# Patient Record
Sex: Female | Born: 1945 | Race: White | Hispanic: No | Smoking: Former smoker
Health system: Southern US, Community
[De-identification: ages and names within clinical notes are randomized; demographics above are authoritative.]

## PROBLEM LIST (undated history)

## (undated) DIAGNOSIS — N189 Chronic kidney disease, unspecified: Secondary | ICD-10-CM

## (undated) DIAGNOSIS — E119 Type 2 diabetes mellitus without complications: Secondary | ICD-10-CM

## (undated) DIAGNOSIS — J449 Chronic obstructive pulmonary disease, unspecified: Secondary | ICD-10-CM

## (undated) DIAGNOSIS — N390 Urinary tract infection, site not specified: Secondary | ICD-10-CM

## (undated) DIAGNOSIS — R0600 Dyspnea, unspecified: Secondary | ICD-10-CM

## (undated) DIAGNOSIS — R350 Frequency of micturition: Secondary | ICD-10-CM

## (undated) DIAGNOSIS — G4730 Sleep apnea, unspecified: Secondary | ICD-10-CM

## (undated) DIAGNOSIS — C801 Malignant (primary) neoplasm, unspecified: Secondary | ICD-10-CM

## (undated) DIAGNOSIS — R32 Unspecified urinary incontinence: Secondary | ICD-10-CM

## (undated) DIAGNOSIS — R300 Dysuria: Secondary | ICD-10-CM

## (undated) DIAGNOSIS — K219 Gastro-esophageal reflux disease without esophagitis: Secondary | ICD-10-CM

## (undated) DIAGNOSIS — R3915 Urgency of urination: Secondary | ICD-10-CM

## (undated) DIAGNOSIS — E7800 Pure hypercholesterolemia, unspecified: Secondary | ICD-10-CM

## (undated) DIAGNOSIS — R809 Proteinuria, unspecified: Secondary | ICD-10-CM

## (undated) DIAGNOSIS — I10 Essential (primary) hypertension: Secondary | ICD-10-CM

## (undated) HISTORY — PX: CHOLECYSTECTOMY: SHX55

## (undated) HISTORY — PX: BREAST BIOPSY: SHX20

## (undated) HISTORY — DX: Urinary tract infection, site not specified: N39.0

## (undated) HISTORY — DX: Dysuria: R30.0

## (undated) HISTORY — DX: Essential (primary) hypertension: I10

## (undated) HISTORY — PX: PARTIAL HYSTERECTOMY: SHX80

## (undated) HISTORY — DX: Frequency of micturition: R35.0

## (undated) HISTORY — DX: Proteinuria, unspecified: R80.9

## (undated) HISTORY — DX: Urgency of urination: R39.15

## (undated) HISTORY — DX: Type 2 diabetes mellitus without complications: E11.9

## (undated) HISTORY — PX: MEDIAL PARTIAL KNEE REPLACEMENT: SHX5965

## (undated) HISTORY — DX: Unspecified urinary incontinence: R32

## (undated) HISTORY — DX: Pure hypercholesterolemia, unspecified: E78.00

## (undated) HISTORY — PX: ABDOMINAL HYSTERECTOMY: SHX81

## (undated) HISTORY — PX: TONSILLECTOMY: SUR1361

---

## 2005-03-19 ENCOUNTER — Ambulatory Visit: Payer: Self-pay | Admitting: Internal Medicine

## 2006-03-25 ENCOUNTER — Ambulatory Visit: Payer: Self-pay | Admitting: Family Medicine

## 2006-09-13 ENCOUNTER — Ambulatory Visit: Payer: Self-pay | Admitting: Internal Medicine

## 2006-11-09 ENCOUNTER — Ambulatory Visit: Payer: Self-pay | Admitting: Oncology

## 2006-11-14 ENCOUNTER — Ambulatory Visit: Payer: Self-pay | Admitting: Oncology

## 2006-12-09 ENCOUNTER — Ambulatory Visit: Payer: Self-pay | Admitting: Oncology

## 2007-01-09 ENCOUNTER — Ambulatory Visit: Payer: Self-pay | Admitting: Oncology

## 2007-03-09 ENCOUNTER — Ambulatory Visit: Payer: Self-pay | Admitting: Oncology

## 2007-03-18 ENCOUNTER — Ambulatory Visit: Payer: Self-pay | Admitting: Oncology

## 2007-04-08 ENCOUNTER — Ambulatory Visit: Payer: Self-pay | Admitting: Family Medicine

## 2007-04-09 ENCOUNTER — Ambulatory Visit: Payer: Self-pay | Admitting: Oncology

## 2007-07-02 ENCOUNTER — Ambulatory Visit: Payer: Self-pay | Admitting: Oncology

## 2007-07-09 ENCOUNTER — Ambulatory Visit: Payer: Self-pay | Admitting: Oncology

## 2007-09-09 ENCOUNTER — Ambulatory Visit: Payer: Self-pay | Admitting: Oncology

## 2007-09-26 ENCOUNTER — Ambulatory Visit: Payer: Self-pay | Admitting: Oncology

## 2007-10-09 ENCOUNTER — Ambulatory Visit: Payer: Self-pay | Admitting: Oncology

## 2007-11-09 ENCOUNTER — Ambulatory Visit: Payer: Self-pay | Admitting: Oncology

## 2008-01-09 ENCOUNTER — Ambulatory Visit: Payer: Self-pay | Admitting: Oncology

## 2008-01-23 ENCOUNTER — Ambulatory Visit: Payer: Self-pay | Admitting: Oncology

## 2008-02-09 ENCOUNTER — Ambulatory Visit: Payer: Self-pay | Admitting: Oncology

## 2008-04-08 ENCOUNTER — Ambulatory Visit: Payer: Self-pay | Admitting: Oncology

## 2008-04-08 ENCOUNTER — Ambulatory Visit: Payer: Self-pay | Admitting: Family Medicine

## 2008-04-16 ENCOUNTER — Ambulatory Visit: Payer: Self-pay | Admitting: Oncology

## 2008-05-08 ENCOUNTER — Ambulatory Visit: Payer: Self-pay | Admitting: Oncology

## 2008-06-08 ENCOUNTER — Ambulatory Visit: Payer: Self-pay | Admitting: Oncology

## 2008-07-08 ENCOUNTER — Ambulatory Visit: Payer: Self-pay | Admitting: Oncology

## 2008-07-09 ENCOUNTER — Ambulatory Visit: Payer: Self-pay | Admitting: Oncology

## 2008-08-08 ENCOUNTER — Ambulatory Visit: Payer: Self-pay | Admitting: Oncology

## 2008-08-27 ENCOUNTER — Ambulatory Visit: Payer: Self-pay | Admitting: Internal Medicine

## 2008-10-07 ENCOUNTER — Ambulatory Visit: Payer: Self-pay | Admitting: Oncology

## 2008-10-08 ENCOUNTER — Ambulatory Visit: Payer: Self-pay | Admitting: Oncology

## 2009-01-08 ENCOUNTER — Ambulatory Visit: Payer: Self-pay | Admitting: Oncology

## 2009-01-14 ENCOUNTER — Ambulatory Visit: Payer: Self-pay | Admitting: Oncology

## 2009-02-08 ENCOUNTER — Ambulatory Visit: Payer: Self-pay | Admitting: Oncology

## 2009-03-08 ENCOUNTER — Ambulatory Visit: Payer: Self-pay | Admitting: Oncology

## 2009-04-08 ENCOUNTER — Ambulatory Visit: Payer: Self-pay | Admitting: Oncology

## 2009-04-11 ENCOUNTER — Ambulatory Visit: Payer: Self-pay | Admitting: Family Medicine

## 2009-04-22 ENCOUNTER — Ambulatory Visit: Payer: Self-pay | Admitting: Oncology

## 2009-05-08 ENCOUNTER — Ambulatory Visit: Payer: Self-pay | Admitting: Oncology

## 2009-07-08 ENCOUNTER — Ambulatory Visit: Payer: Self-pay | Admitting: Oncology

## 2009-07-19 ENCOUNTER — Ambulatory Visit: Payer: Self-pay | Admitting: Oncology

## 2009-08-08 ENCOUNTER — Ambulatory Visit: Payer: Self-pay | Admitting: Oncology

## 2009-09-08 ENCOUNTER — Ambulatory Visit: Payer: Self-pay | Admitting: Oncology

## 2009-09-30 ENCOUNTER — Ambulatory Visit: Payer: Self-pay | Admitting: Oncology

## 2009-10-08 ENCOUNTER — Ambulatory Visit: Payer: Self-pay | Admitting: Oncology

## 2009-12-22 ENCOUNTER — Ambulatory Visit: Payer: Self-pay | Admitting: Oncology

## 2010-01-08 ENCOUNTER — Ambulatory Visit: Payer: Self-pay | Admitting: Oncology

## 2010-02-21 ENCOUNTER — Ambulatory Visit: Payer: Self-pay | Admitting: Family Medicine

## 2010-03-16 ENCOUNTER — Ambulatory Visit: Payer: Self-pay | Admitting: Oncology

## 2010-03-20 ENCOUNTER — Ambulatory Visit: Payer: Self-pay | Admitting: Surgery

## 2010-04-09 ENCOUNTER — Ambulatory Visit: Payer: Self-pay | Admitting: Oncology

## 2010-04-13 ENCOUNTER — Ambulatory Visit: Payer: Self-pay | Admitting: Family Medicine

## 2010-04-28 ENCOUNTER — Ambulatory Visit: Payer: Self-pay | Admitting: Internal Medicine

## 2010-06-15 ENCOUNTER — Ambulatory Visit: Payer: Self-pay | Admitting: Oncology

## 2010-07-09 ENCOUNTER — Ambulatory Visit: Payer: Self-pay | Admitting: Oncology

## 2010-09-20 ENCOUNTER — Ambulatory Visit: Payer: Self-pay | Admitting: Oncology

## 2010-10-06 ENCOUNTER — Ambulatory Visit: Payer: Self-pay | Admitting: Gastroenterology

## 2010-10-09 ENCOUNTER — Ambulatory Visit: Payer: Self-pay | Admitting: Oncology

## 2010-12-25 ENCOUNTER — Ambulatory Visit: Payer: Self-pay | Admitting: Oncology

## 2011-01-09 ENCOUNTER — Ambulatory Visit: Payer: Self-pay | Admitting: Oncology

## 2011-04-24 ENCOUNTER — Ambulatory Visit: Payer: Self-pay | Admitting: Family Medicine

## 2011-04-24 ENCOUNTER — Ambulatory Visit: Payer: Self-pay | Admitting: Internal Medicine

## 2011-04-24 ENCOUNTER — Ambulatory Visit: Payer: Self-pay | Admitting: Oncology

## 2011-04-24 LAB — CBC CANCER CENTER
Basophil #: 0.1 x10 3/mm (ref 0.0–0.1)
Basophil %: 1.2 %
Eosinophil #: 1.3 x10 3/mm — ABNORMAL HIGH (ref 0.0–0.7)
Lymphocyte %: 19.4 %
MCH: 28.9 pg (ref 26.0–34.0)
MCV: 88 fL (ref 80–100)
Monocyte #: 0.8 x10 3/mm (ref 0.2–0.9)

## 2011-04-24 LAB — FERRITIN: Ferritin (ARMC): 23 ng/mL (ref 8–388)

## 2011-04-24 LAB — IRON AND TIBC
Iron Bind.Cap.(Total): 378 ug/dL (ref 250–450)
Iron Saturation: 26 %
Unbound Iron-Bind.Cap.: 281 ug/dL

## 2011-05-09 ENCOUNTER — Ambulatory Visit: Payer: Self-pay | Admitting: Internal Medicine

## 2011-08-27 ENCOUNTER — Ambulatory Visit: Payer: Self-pay | Admitting: Unknown Physician Specialty

## 2011-10-10 ENCOUNTER — Ambulatory Visit: Payer: Self-pay | Admitting: Oncology

## 2011-10-10 LAB — CBC CANCER CENTER
Basophil #: 0.1 x10 3/mm (ref 0.0–0.1)
Eosinophil #: 0.2 x10 3/mm (ref 0.0–0.7)
HCT: 36.9 % (ref 35.0–47.0)
HGB: 11.8 g/dL — ABNORMAL LOW (ref 12.0–16.0)
Lymphocyte #: 1.6 x10 3/mm (ref 1.0–3.6)
Lymphocyte %: 28.1 %
MCV: 88 fL (ref 80–100)
Monocyte #: 0.8 x10 3/mm (ref 0.2–0.9)
Monocyte %: 14.2 %
Neutrophil %: 52.7 %
Platelet: 343 x10 3/mm (ref 150–440)
RBC: 4.21 10*6/uL (ref 3.80–5.20)
RDW: 16.5 % — ABNORMAL HIGH (ref 11.5–14.5)
WBC: 5.8 x10 3/mm (ref 3.6–11.0)

## 2011-10-10 LAB — IRON AND TIBC
Iron Saturation: 9 %
Unbound Iron-Bind.Cap.: 366 ug/dL

## 2011-10-10 LAB — FERRITIN: Ferritin (ARMC): 11 ng/mL (ref 8–388)

## 2011-11-09 ENCOUNTER — Ambulatory Visit: Payer: Self-pay | Admitting: Oncology

## 2012-02-01 ENCOUNTER — Ambulatory Visit: Payer: Self-pay | Admitting: Unknown Physician Specialty

## 2012-04-02 ENCOUNTER — Ambulatory Visit: Payer: Self-pay | Admitting: Oncology

## 2012-04-02 LAB — CBC CANCER CENTER
Basophil #: 0.1 x10 3/mm (ref 0.0–0.1)
Basophil %: 1.1 %
Eosinophil #: 0.2 x10 3/mm (ref 0.0–0.7)
Eosinophil %: 2.6 %
HGB: 12.1 g/dL (ref 12.0–16.0)
Lymphocyte #: 2 x10 3/mm (ref 1.0–3.6)
Lymphocyte %: 25.1 %
MCH: 29.1 pg (ref 26.0–34.0)
MCHC: 32.7 g/dL (ref 32.0–36.0)
Monocyte %: 8.3 %
Neutrophil #: 5 x10 3/mm (ref 1.4–6.5)
Neutrophil %: 62.9 %
RBC: 4.17 10*6/uL (ref 3.80–5.20)
RDW: 13.9 % (ref 11.5–14.5)
WBC: 8 x10 3/mm (ref 3.6–11.0)

## 2012-04-02 LAB — IRON AND TIBC
Iron Bind.Cap.(Total): 387 ug/dL (ref 250–450)
Iron Saturation: 22 %
Iron: 87 ug/dL (ref 50–170)
Unbound Iron-Bind.Cap.: 300 ug/dL

## 2012-04-08 ENCOUNTER — Ambulatory Visit: Payer: Self-pay | Admitting: Oncology

## 2012-04-16 LAB — CBC CANCER CENTER
Basophil #: 0.1 x10 3/mm (ref 0.0–0.1)
Eosinophil #: 0.1 x10 3/mm (ref 0.0–0.7)
Eosinophil %: 1 %
HGB: 12 g/dL (ref 12.0–16.0)
Lymphocyte #: 2.6 x10 3/mm (ref 1.0–3.6)
MCH: 29.3 pg (ref 26.0–34.0)
MCHC: 33 g/dL (ref 32.0–36.0)
MCV: 89 fL (ref 80–100)
Monocyte %: 9.6 %
Neutrophil %: 60.3 %
Platelet: 387 x10 3/mm (ref 150–440)

## 2012-04-16 LAB — IRON AND TIBC
Iron Bind.Cap.(Total): 432 ug/dL (ref 250–450)
Iron: 61 ug/dL (ref 50–170)

## 2012-04-16 LAB — FERRITIN: Ferritin (ARMC): 27 ng/mL (ref 8–388)

## 2012-04-29 ENCOUNTER — Ambulatory Visit: Payer: Self-pay | Admitting: Internal Medicine

## 2012-05-08 ENCOUNTER — Ambulatory Visit: Payer: Self-pay | Admitting: Oncology

## 2012-05-29 ENCOUNTER — Ambulatory Visit: Payer: Self-pay | Admitting: Specialist

## 2012-07-04 ENCOUNTER — Ambulatory Visit: Payer: Self-pay | Admitting: Internal Medicine

## 2012-09-05 ENCOUNTER — Ambulatory Visit: Payer: Self-pay | Admitting: Unknown Physician Specialty

## 2012-10-22 ENCOUNTER — Ambulatory Visit: Payer: Self-pay | Admitting: Oncology

## 2012-10-22 LAB — IRON AND TIBC
Iron Bind.Cap.(Total): 420 ug/dL (ref 250–450)
Iron Saturation: 15 %
Iron: 62 ug/dL (ref 50–170)
Unbound Iron-Bind.Cap.: 358 ug/dL

## 2012-10-22 LAB — FERRITIN: Ferritin (ARMC): 11 ng/mL (ref 8–388)

## 2012-11-04 ENCOUNTER — Ambulatory Visit: Payer: Self-pay | Admitting: Unknown Physician Specialty

## 2012-11-08 ENCOUNTER — Ambulatory Visit: Payer: Self-pay | Admitting: Oncology

## 2012-11-20 ENCOUNTER — Ambulatory Visit: Payer: Self-pay | Admitting: Specialist

## 2013-01-27 ENCOUNTER — Ambulatory Visit: Payer: Self-pay | Admitting: Vascular Surgery

## 2013-01-27 LAB — BASIC METABOLIC PANEL
Anion Gap: 6 — ABNORMAL LOW (ref 7–16)
BUN: 13 mg/dL (ref 7–18)
CALCIUM: 9.3 mg/dL (ref 8.5–10.1)
CO2: 30 mmol/L (ref 21–32)
Chloride: 100 mmol/L (ref 98–107)
Creatinine: 1.05 mg/dL (ref 0.60–1.30)
EGFR (Non-African Amer.): 55 — ABNORMAL LOW
Glucose: 150 mg/dL — ABNORMAL HIGH (ref 65–99)
OSMOLALITY: 275 (ref 275–301)
Potassium: 4 mmol/L (ref 3.5–5.1)
Sodium: 136 mmol/L (ref 136–145)

## 2013-03-12 ENCOUNTER — Ambulatory Visit: Payer: Self-pay | Admitting: Unknown Physician Specialty

## 2013-04-28 ENCOUNTER — Ambulatory Visit: Payer: Self-pay | Admitting: Internal Medicine

## 2013-04-28 LAB — FERRITIN: Ferritin (ARMC): 31 ng/mL (ref 8–388)

## 2013-04-28 LAB — CBC CANCER CENTER
BASOS ABS: 0.1 x10 3/mm (ref 0.0–0.1)
Basophil %: 1.1 %
EOS PCT: 6.5 %
Eosinophil #: 0.6 x10 3/mm (ref 0.0–0.7)
HCT: 36.9 % (ref 35.0–47.0)
HGB: 12 g/dL (ref 12.0–16.0)
Lymphocyte #: 2 x10 3/mm (ref 1.0–3.6)
Lymphocyte %: 23.5 %
MCH: 30.5 pg (ref 26.0–34.0)
MCHC: 32.6 g/dL (ref 32.0–36.0)
MCV: 94 fL (ref 80–100)
MONOS PCT: 10 %
Monocyte #: 0.9 x10 3/mm (ref 0.2–0.9)
NEUTROS ABS: 5.1 x10 3/mm (ref 1.4–6.5)
Neutrophil %: 58.9 %
Platelet: 382 x10 3/mm (ref 150–440)
RBC: 3.94 10*6/uL (ref 3.80–5.20)
RDW: 14.2 % (ref 11.5–14.5)
WBC: 8.6 x10 3/mm (ref 3.6–11.0)

## 2013-04-28 LAB — IRON AND TIBC
IRON SATURATION: 29 %
IRON: 105 ug/dL (ref 50–170)
Iron Bind.Cap.(Total): 361 ug/dL (ref 250–450)
UNBOUND IRON-BIND. CAP.: 256 ug/dL

## 2013-04-30 ENCOUNTER — Ambulatory Visit: Payer: Self-pay | Admitting: Internal Medicine

## 2013-05-08 ENCOUNTER — Ambulatory Visit: Payer: Self-pay | Admitting: Oncology

## 2013-05-19 DIAGNOSIS — R9430 Abnormal result of cardiovascular function study, unspecified: Secondary | ICD-10-CM | POA: Insufficient documentation

## 2013-05-28 ENCOUNTER — Ambulatory Visit: Payer: Self-pay | Admitting: Specialist

## 2013-06-02 ENCOUNTER — Ambulatory Visit: Payer: Self-pay | Admitting: Family Medicine

## 2013-10-19 DIAGNOSIS — K2100 Gastro-esophageal reflux disease with esophagitis, without bleeding: Secondary | ICD-10-CM | POA: Insufficient documentation

## 2013-10-19 DIAGNOSIS — G4730 Sleep apnea, unspecified: Secondary | ICD-10-CM | POA: Insufficient documentation

## 2013-10-19 DIAGNOSIS — E1140 Type 2 diabetes mellitus with diabetic neuropathy, unspecified: Secondary | ICD-10-CM | POA: Insufficient documentation

## 2013-10-19 DIAGNOSIS — E1169 Type 2 diabetes mellitus with other specified complication: Secondary | ICD-10-CM | POA: Insufficient documentation

## 2013-10-19 DIAGNOSIS — M1990 Unspecified osteoarthritis, unspecified site: Secondary | ICD-10-CM | POA: Insufficient documentation

## 2013-10-28 ENCOUNTER — Ambulatory Visit: Payer: Self-pay | Admitting: Oncology

## 2013-10-28 LAB — CBC CANCER CENTER
Basophil #: 0.1 x10 3/mm (ref 0.0–0.1)
Basophil %: 1.5 %
EOS ABS: 0.3 x10 3/mm (ref 0.0–0.7)
EOS PCT: 3.6 %
HCT: 35.5 % (ref 35.0–47.0)
HGB: 11.6 g/dL — ABNORMAL LOW (ref 12.0–16.0)
Lymphocyte #: 2 x10 3/mm (ref 1.0–3.6)
Lymphocyte %: 27.2 %
MCH: 28.5 pg (ref 26.0–34.0)
MCHC: 32.8 g/dL (ref 32.0–36.0)
MCV: 87 fL (ref 80–100)
Monocyte #: 0.7 x10 3/mm (ref 0.2–0.9)
Monocyte %: 9.8 %
Neutrophil #: 4.2 x10 3/mm (ref 1.4–6.5)
Neutrophil %: 57.9 %
PLATELETS: 360 x10 3/mm (ref 150–440)
RBC: 4.09 10*6/uL (ref 3.80–5.20)
RDW: 15.1 % — ABNORMAL HIGH (ref 11.5–14.5)
WBC: 7.2 x10 3/mm (ref 3.6–11.0)

## 2013-10-28 LAB — IRON AND TIBC
IRON BIND. CAP.(TOTAL): 399 ug/dL (ref 250–450)
Iron Saturation: 16 %
Iron: 65 ug/dL (ref 50–170)
Unbound Iron-Bind.Cap.: 334 ug/dL

## 2013-10-28 LAB — FERRITIN: Ferritin (ARMC): 19 ng/mL (ref 8–388)

## 2013-11-08 ENCOUNTER — Ambulatory Visit: Payer: Self-pay | Admitting: Oncology

## 2013-11-17 ENCOUNTER — Ambulatory Visit: Payer: Self-pay | Admitting: Internal Medicine

## 2013-12-08 ENCOUNTER — Ambulatory Visit: Payer: Self-pay | Admitting: Internal Medicine

## 2014-02-19 ENCOUNTER — Ambulatory Visit: Payer: Self-pay | Admitting: Oncology

## 2014-03-09 ENCOUNTER — Ambulatory Visit
Admit: 2014-03-09 | Disposition: A | Discharge status: Home / Self Care | Payer: Self-pay | Attending: Oncology | Admitting: Oncology

## 2014-04-23 ENCOUNTER — Other Ambulatory Visit: Payer: Self-pay | Admitting: Specialist

## 2014-04-23 DIAGNOSIS — R69 Illness, unspecified: Secondary | ICD-10-CM

## 2014-05-01 NOTE — Op Note (Signed)
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   PROCEDURES PERFORMED: 1.  Abdominal aortogram.  2.  Bilateral lower extremity distal runoff.  3.  Introduction of catheter, aorta, right femoral approach.  4.  Introduction of catheter, aorta, left femoral approach.  5.  Percutaneous transluminal angioplasty and stent placement, left common iliac artery.  6.  Percutaneous transluminal angioplasty and stent placement, right common iliac artery.   SURGEON: Hortencia Pilar, M.D.   SEDATION: Versed 3 mg plus fentanyl 100 mcg administered IV. Continuous ECG, pulse oximetry and cardiopulmonary monitoring is performed throughout the entire procedure by the interventional radiology nurse. Total sedation time is 1 hour, 15 minutes.   ACCESSES: 1.  A 6-French sheath, right common femoral artery.  2.  A 6-French sheath, left common femoral artery.   CONTRAST USED: Isovue 105 mL.   FLUOROSCOPY TIME: 5.6 minutes.   INDICATIONS: Jenna Olson is a 69 year old woman who presented with increasing lower extremity pain, particularly with walking. She notes that the right leg is much worse than the left. On the initial evaluation, the patient felt that she would try to ambulate more to see if this would alleviate her symptoms; however, she now returns for followup stating that she has had no improvement and that the difficulty with walking is profoundly impacting her life in a very negative way in which she wishes to have something done. Risks and benefits were reviewed. All questions answered. The patient agrees to proceed.   DESCRIPTION OF PROCEDURE: The patient is taken to special procedures and  placed in the supine position. After adequate sedation is achieved, both groins are prepped and draped in sterile fashion. Ultrasound is placed in a sterile sleeve. Left common femoral artery is identified. It is echolucent and pulsatile indicating patency. Image is recorded for the permanent record. Under direct ultrasound visualization, 1% lidocaine is infiltrated in the soft tissues and subsequently a micropuncture needle is inserted, microwire followed by microsheath, J-wire followed by 5-French sheath and 5-French pigtail catheter. The pigtail catheter is positioned at the level of T12 and AP projection of the aorta is obtained with a bolus injection of contrast.   The pigtail catheter is then repositioned and a LAO view of the pelvis is obtained. A string sign is noted in the proximal right common iliac and therefore 4000 units of heparin was given. Magic torque wire is then advanced through the pigtail catheter on the left, and the sheath is upsized to a 6-French sheath.   Using ultrasound guidance, the right common femoral artery is identified. It is echolucent and pulsatile indicating patency. Image is recorded for the permanent record. Under real-time visualization lidocaine is infiltrated in the soft tissues and then a micropuncture needle is used to access the anterior wall of the common femoral artery, microwire followed by microsheath, J-wire followed by a 6-French sheath. Magic torque wire is then negotiated through the stenosis and into the aorta from the right groin approach. Having both wires in place and having heparinized the patient, kissing balloon technique will be utilized. Measurements are made of the iliacs as well as the aorta and it is elected to use a 7 x 29 Omnilink stent on the right and a 6 x 6 balloon on the  left. The stent is positioned with its tip just a millimeter or so above the actual flow divider of the aorta and simultaneous inflation is made. Subsequent image through the  pigtail catheter demonstrates that there is some overhang from the right into the left and therefore a 7 x 29 Omnilink is advanced up the left side and the 7 balloon is reintroduced on the right. Simultaneous inflation is made to complete a kissing balloon/stent technique. Follow-up angiography through a pigtail catheter demonstrates the right side is still somewhat undersized as is the left, and therefore an 8 x 4 Rival balloon is advanced first up the left side. With a 7 balloon on the right, simultaneous inflation is performed and then the balloons are switched and the right side is treated with the 8 balloon while the left is supported with a 7 balloon.   The pigtail catheter is then reintroduced via the left groin approach and subsequently imaging is performed of the aortic bifurcation and then after verifying adequate results with the stents down to the level of the knees. Subsequently, the trifurcation is imaged by hand injection through each individual sheath. Oblique view of the right groin is then obtained and a StarClose device deployed successfully. Oblique view of the left groin is then obtained and a StarClose device deployed successfully. There are no immediate complications.   INTERPRETATION: Images of the abdominal aorta demonstrates mild atherosclerotic changes, but there are no hemodynamically significant stenoses. The aorta does taper somewhat from its diameter of approximately 12 mm to 9 mm distally and there is a high-grade string sign noted in the proximal common iliac on the right. The left demonstrates diffuse disease, but does not appear to have a focal hemodynamically significant stenosis and therefore I elected to try to use a stent on just one side with kissing balloon on the other. This was not successful in controlling the hangover of the right-sided stent and therefore a stent was added on the left side in a traditional fashion. Following stenting there is wide patency of both  common iliacs and both have been dilated to a maximal diameter of 8 mm. The external iliacs, common femorals, profunda femoris, superficial femorals, and trifurcations are widely patent bilaterally.   SUMMARY: Successful recanalization of the distal aorta and bilateral common iliacs as described above. ____________________________ Katha Cabal, MD ggs:sb D: 01/27/2013 14:45:31 ET T: 01/27/2013 15:35:47 ET JOB#: [id number]  cc: Katha Cabal, MD, <Dictator> Kathrene Alu., MD Christena Flake. Raechel Ache, [place] MD ELECTRONICALLY SIGNED 02/18/2013 13:24

## 2014-05-03 ENCOUNTER — Ambulatory Visit
Admit: 2014-05-03 | Disposition: A | Discharge status: Home / Self Care | Payer: Self-pay | Attending: Gastroenterology | Admitting: Gastroenterology

## 2014-05-04 ENCOUNTER — Ambulatory Visit
Admit: 2014-05-04 | Disposition: A | Discharge status: Home / Self Care | Payer: Self-pay | Attending: Internal Medicine | Admitting: Internal Medicine

## 2014-05-05 LAB — SURGICAL PATHOLOGY

## 2014-05-18 ENCOUNTER — Other Ambulatory Visit: Payer: Self-pay | Admitting: Oncology

## 2014-05-18 DIAGNOSIS — D509 Iron deficiency anemia, unspecified: Secondary | ICD-10-CM | POA: Insufficient documentation

## 2014-05-19 ENCOUNTER — Inpatient Hospital Stay: Payer: Medicare PPO | Attending: Oncology

## 2014-05-19 DIAGNOSIS — I2510 Atherosclerotic heart disease of native coronary artery without angina pectoris: Secondary | ICD-10-CM | POA: Insufficient documentation

## 2014-05-19 DIAGNOSIS — E119 Type 2 diabetes mellitus without complications: Secondary | ICD-10-CM | POA: Diagnosis not present

## 2014-05-19 DIAGNOSIS — D509 Iron deficiency anemia, unspecified: Secondary | ICD-10-CM | POA: Diagnosis present

## 2014-05-19 DIAGNOSIS — I10 Essential (primary) hypertension: Secondary | ICD-10-CM | POA: Diagnosis not present

## 2014-05-19 DIAGNOSIS — Z79899 Other long term (current) drug therapy: Secondary | ICD-10-CM | POA: Insufficient documentation

## 2014-05-19 DIAGNOSIS — E780 Pure hypercholesterolemia: Secondary | ICD-10-CM | POA: Diagnosis not present

## 2014-05-19 LAB — FERRITIN: Ferritin: 70 ng/mL (ref 11–307)

## 2014-05-19 LAB — CBC WITH DIFFERENTIAL/PLATELET
Basophils Absolute: 0.1 10*3/uL (ref 0–0.1)
Basophils Relative: 1 %
Eosinophils Absolute: 0.3 10*3/uL (ref 0–0.7)
Eosinophils Relative: 3 %
HEMATOCRIT: 38.9 % (ref 35.0–47.0)
Hemoglobin: 13.1 g/dL (ref 12.0–16.0)
LYMPHS ABS: 2.2 10*3/uL (ref 1.0–3.6)
LYMPHS PCT: 24 %
MCH: 30.5 pg (ref 26.0–34.0)
MCHC: 33.5 g/dL (ref 32.0–36.0)
MCV: 91 fL (ref 80.0–100.0)
Monocytes Absolute: 1 10*3/uL — ABNORMAL HIGH (ref 0.2–0.9)
Monocytes Relative: 12 %
NEUTROS PCT: 60 %
Neutro Abs: 5.3 10*3/uL (ref 1.4–6.5)
PLATELETS: 328 10*3/uL (ref 150–440)
RBC: 4.28 MIL/uL (ref 3.80–5.20)
RDW: 17.9 % — AB (ref 11.5–14.5)
WBC: 8.9 10*3/uL (ref 3.6–11.0)

## 2014-05-19 LAB — IRON AND TIBC
Iron: 59 ug/dL (ref 28–170)
SATURATION RATIOS: 17 % (ref 10.4–31.8)
TIBC: 340 ug/dL (ref 250–450)
UIBC: 282 ug/dL

## 2014-05-21 ENCOUNTER — Encounter: Payer: Self-pay | Admitting: Oncology

## 2014-05-21 ENCOUNTER — Inpatient Hospital Stay (HOSPITAL_BASED_OUTPATIENT_CLINIC_OR_DEPARTMENT_OTHER): Payer: Medicare PPO | Admitting: Oncology

## 2014-05-21 ENCOUNTER — Inpatient Hospital Stay: Payer: Medicare PPO

## 2014-05-21 VITALS — BP 147/74 | HR 85 | Temp 96.8°F | Resp 20 | Wt 198.4 lb

## 2014-05-21 DIAGNOSIS — E119 Type 2 diabetes mellitus without complications: Secondary | ICD-10-CM | POA: Diagnosis not present

## 2014-05-21 DIAGNOSIS — I10 Essential (primary) hypertension: Secondary | ICD-10-CM | POA: Diagnosis not present

## 2014-05-21 DIAGNOSIS — Z79899 Other long term (current) drug therapy: Secondary | ICD-10-CM

## 2014-05-21 DIAGNOSIS — D509 Iron deficiency anemia, unspecified: Secondary | ICD-10-CM

## 2014-05-31 ENCOUNTER — Ambulatory Visit
Admission: RE | Admit: 2014-05-31 | Discharge: 2014-05-31 | Disposition: A | Discharge status: Home / Self Care | Payer: Medicare PPO | Source: Ambulatory Visit | Attending: Specialist | Admitting: Specialist

## 2014-05-31 DIAGNOSIS — R69 Illness, unspecified: Secondary | ICD-10-CM

## 2014-05-31 DIAGNOSIS — R918 Other nonspecific abnormal finding of lung field: Secondary | ICD-10-CM | POA: Insufficient documentation

## 2014-06-09 NOTE — Progress Notes (Signed)
[facility]  [phone] Fax:(336) [phone]  ID: Duanne Limerick OB: 04/06/1945  MR#: [id number]  [id number]  Patient Care Team: Ezequiel Kayser, MD as PCP - General (Internal Medicine)  CHIEF COMPLAINT:  Chief Complaint  Patient presents with  . Follow-up    feraheme   INTERVAL HISTORY: Patient returns to clinic today for repeat laboratory work and further evaluation. She currently feels well and is asymptomatic. She does not complain of weakness or fatigue today. She denies any shortness of breath, dyspnea on exertion, chest pain, or cough. She denies any melena or hematochezia. She has had no fever, chills, night sweats, or weight loss.  She offers no specific complaints today.  REVIEW OF SYSTEMS:   Review of Systems  Constitutional: Negative.  Negative for malaise/fatigue.  Respiratory: Negative.   Neurological: Negative for weakness.    As per HPI. Otherwise, a complete review of systems is negatve.  PAST MEDICAL HISTORY: Past Medical History  Diagnosis Date  . Diabetes mellitus without complication   . Hypertension   . High cholesterol     PAST SURGICAL HISTORY: Past Surgical History  Procedure Laterality Date  . Partial hysterectomy    . Cholecystectomy      FAMILY HISTORY Family History  Problem Relation Age of Onset  . Cancer Grandchild     ewing sarcoma       ADVANCED DIRECTIVES:    HEALTH MAINTENANCE: History  Substance Use Topics  . Smoking status: Not on file  . Smokeless tobacco: Not on file  . Alcohol Use: Not on file     Colonoscopy:  PAP:  Bone density:  Lipid panel:  Allergies not on file  Current Outpatient Prescriptions  Medication Sig Dispense Refill  . acetaminophen (TYLENOL) 500 MG tablet Take 500 mg by mouth every 6 (six) hours as needed.    Marland Kitchen aspirin 81 MG tablet Take 81 mg by mouth daily.    Marland Kitchen azelastine (ASTELIN) 0.1 % nasal spray Place 2 sprays into both nostrils 2 (two) times daily.  Use in each nostril as directed    . cholecalciferol (VITAMIN D) 400 UNITS TABS tablet Take 400 Units by mouth daily.    . diphenhydrAMINE (SOMINEX) 25 MG tablet Take 25 mg by mouth at bedtime as needed for sleep.    Marland Kitchen esomeprazole (NEXIUM) 40 MG capsule     . fexofenadine (ALLEGRA) 180 MG tablet Take 180 mg by mouth daily.    . fluticasone (FLONASE) 50 MCG/ACT nasal spray     . furosemide (LASIX) 20 MG tablet     . gabapentin (NEURONTIN) 800 MG tablet     . glimepiride (AMARYL) 2 MG tablet     . hydrALAZINE (APRESOLINE) 50 MG tablet     . metFORMIN (GLUCOPHAGE) 500 MG tablet     . niacin (NIASPAN) 500 MG CR tablet     . nitrofurantoin (MACRODANTIN) 100 MG capsule     . polyethylene glycol (MIRALAX / GLYCOLAX) packet Take 17 g by mouth daily.    . pravastatin (PRAVACHOL) 40 MG tablet     . rOPINIRole (REQUIP) 1 MG tablet     . traZODone (DESYREL) 50 MG tablet     . valsartan (DIOVAN) 320 MG tablet     . venlafaxine XR (EFFEXOR-XR) 75 MG 24 hr capsule     . vitamin B-12 (CYANOCOBALAMIN) 250 MCG tablet Take 250 mcg by mouth daily.     No current facility-administered medications for this visit.  OBJECTIVE: Filed Vitals:   05/21/14 1003  BP: 147/74  Pulse: 85  Temp: 96.8 F (36 C)  Resp: 20     There is no height on file to calculate BMI.    ECOG FS:0 - Asymptomatic  General: Well-developed, well-nourished, no acute distress. Eyes: anicteric sclera. Lungs: Clear to auscultation bilaterally. Heart: Regular rate and rhythm. No rubs, murmurs, or gallops. Abdomen: Soft, nontender, nondistended. No organomegaly noted, normoactive bowel sounds. Musculoskeletal: No edema, cyanosis, or clubbing. Neuro: Alert, answering all questions appropriately. Cranial nerves grossly intact. Skin: No rashes or petechiae noted. Psych: Normal affect.    LAB RESULTS:  Lab Results  Component Value Date   NA 136 01/27/2013   K 4.0 01/27/2013   CL 100 01/27/2013   CO2 30 01/27/2013   GLUCOSE  150* 01/27/2013   BUN 13 01/27/2013   CREATININE 1.05 01/27/2013   CALCIUM 9.3 01/27/2013   GFRNONAA 55* 01/27/2013   GFRAA >60 01/27/2013    Lab Results  Component Value Date   WBC 8.9 05/19/2014   NEUTROABS 5.3 05/19/2014   HGB 13.1 05/19/2014   HCT 38.9 05/19/2014   MCV 91.0 05/19/2014   PLT 328 05/19/2014     STUDIES: Ct Chest Wo Contrast  05/31/2014   CLINICAL DATA:  Subsequent encounter for pulmonary nodules.  EXAM: CT CHEST WITHOUT CONTRAST  TECHNIQUE: Multidetector CT imaging of the chest was performed following the standard protocol without IV contrast.  COMPARISON:  05/28/2013.  05/29/2012.  FINDINGS: Mediastinum / Lymph Nodes: There is no axillary lymphadenopathy. Small scattered lymph nodes are seen in the mediastinum. No hilar lymphadenopathy. Thoracic esophagus is normal in appearance. Heart size is normal. Coronary artery calcification is noted. No pericardial effusion.  Lungs / Pleura: Centrilobular and paraseptal emphysema is noted bilaterally. The pair of tiny nodules seen in the right upper lobe previously is unchanged (see image 16 and 17 of series 3 today). No new or enlarging pulmonary nodule or mass. No focal airspace consolidation. No pulmonary edema or pleural effusion.  MSK / Soft Tissues: Bone windows reveal no worrisome lytic or sclerotic osseous lesions.  Upper Abdomen: Visualized portions of the liver and spleen are unremarkable. Cortical scarring is noted in the visualized portions of the kidneys. Tiny left adrenal nodule is stable with density measurements consistent with adenoma.  IMPRESSION: No change in the tiny right upper lobe pulmonary nodules, each measuring about 3 mm in size. These been stable for 2 years, consistent with benign disease. No further imaging followup of these nodules is indicated. This recommendation follows the consensus statement: Guidelines for Management of Small Pulmonary Nodules Detected on CT Scans: A Statement from the [employer] as published in Radiology 2005; 237:395-400.  No new or progressive findings on today's study.   Electronically Signed   By: Misty Stanley M.D.   On: 05/31/2014 14:39    ASSESSMENT: Iron-deficiency anemia, refractory oral iron therapy.  PLAN:    1.  Anemia:  Patient's hemoglobin and iron stores are now within normal limits. She last received Feraheme in February 2016. No intervention is needed at this time. Patient will then return to clinic in 4 months with repeat laboratory work, further evaluation, and consideration of additional IV iron.  Previously, colonoscopy, EGD, and capsule endoscopy in 2007 did not reveal an etiology of her iron deficiency anemia.     Patient expressed understanding and was in agreement with this plan. She also understands that She can call clinic at any time with any  questions, concerns, or complaints.    Lloyd Huger, MD   06/09/2014 1:04 PM

## 2014-06-18 DIAGNOSIS — I10 Essential (primary) hypertension: Secondary | ICD-10-CM | POA: Insufficient documentation

## 2014-06-29 DIAGNOSIS — I872 Venous insufficiency (chronic) (peripheral): Secondary | ICD-10-CM | POA: Insufficient documentation

## 2014-07-23 ENCOUNTER — Other Ambulatory Visit: Payer: Self-pay | Admitting: Urology

## 2014-07-26 NOTE — Telephone Encounter (Signed)
Can pt have refills?

## 2014-08-26 ENCOUNTER — Encounter: Payer: Self-pay | Admitting: *Deleted

## 2014-09-06 ENCOUNTER — Encounter: Payer: Self-pay | Admitting: Urology

## 2014-09-06 ENCOUNTER — Ambulatory Visit (INDEPENDENT_AMBULATORY_CARE_PROVIDER_SITE_OTHER): Payer: Medicare PPO | Admitting: Urology

## 2014-09-06 VITALS — BP 165/81 | HR 102 | Ht 62.0 in | Wt 202.5 lb

## 2014-09-06 DIAGNOSIS — N390 Urinary tract infection, site not specified: Secondary | ICD-10-CM | POA: Insufficient documentation

## 2014-09-06 DIAGNOSIS — R350 Frequency of micturition: Secondary | ICD-10-CM

## 2014-09-06 LAB — URINALYSIS, COMPLETE
Bilirubin, UA: NEGATIVE
Glucose, UA: NEGATIVE
KETONES UA: NEGATIVE
NITRITE UA: NEGATIVE
PROTEIN UA: NEGATIVE
RBC, UA: NEGATIVE
Specific Gravity, UA: <1.005 — ABNORMAL LOW (ref 1.005–1.030)
UUROB: 0.2 mg/dL (ref 0.2–1.0)
pH, UA: 5.5 (ref 5.0–7.5)

## 2014-09-06 LAB — MICROSCOPIC EXAMINATION: RBC, UA: NONE SEEN /hpf (ref 0–?)

## 2014-09-06 LAB — BLADDER SCAN AMB NON-IMAGING: Scan Result: 106

## 2014-09-06 NOTE — Progress Notes (Signed)
09/06/2014 4:13 PM   Jenna Olson 03-31-1945 [id number]  Referring provider: Ezequiel Kayser, MD [name], [name]  Chief Complaint  Patient presents with  . Follow-up    HPI: Patient is a 69 year old white female who has been on Macrobid every other day for prophylactic antibiotic for recurrent urinary tract infections. She presents today for yearly follow-up.  She states one month ago she started to experience an increase of frequent urination, urinary urgency, nocturia and straining to urinate. She has very mild stress incontinence and no urge incontinence.  She is having to use the toilet every 2 hours.  She denies any dysuria, suprapubic pain or gross hematuria.  She has not had any recent fevers, chills, nausea or vomiting.  Her UA showed contamination with WBCs and vaginal up at the heel cells. It was nitrite negative.   PMH: Past Medical History  Diagnosis Date  . Diabetes mellitus without complication   . Hypertension   . High cholesterol   . Urinary frequency   . UTI (lower urinary tract infection)   . Dysuria   . Incontinence   . Urinary urgency   . Proteinuria     Surgical History: Past Surgical History  Procedure Laterality Date  . Partial hysterectomy    . Cholecystectomy      Home Medications:    Medication List       This list is accurate as of: 09/06/14  4:13 PM.  Always use your most recent med list.               acetaminophen 500 MG tablet  Commonly known as:  TYLENOL  Take 500 mg by mouth every 6 (six) hours as needed.     aspirin 81 MG tablet  Take 81 mg by mouth daily.     azelastine 0.1 % nasal spray  Commonly known as:  ASTELIN  Place 2 sprays into both nostrils 2 (two) times daily. Use in each nostril as directed     cholecalciferol 400 UNITS Tabs tablet  Commonly known as:  VITAMIN D  Take 400 Units by mouth daily.     diphenhydrAMINE 25 MG tablet  Commonly known as:  SOMINEX  Take 25 mg by  mouth at bedtime as needed for sleep.     esomeprazole 40 MG capsule  Commonly known as:  NEXIUM     fexofenadine 180 MG tablet  Commonly known as:  ALLEGRA  Take 180 mg by mouth daily.     fluticasone 50 MCG/ACT nasal spray  Commonly known as:  FLONASE     furosemide 20 MG tablet  Commonly known as:  LASIX     gabapentin 800 MG tablet  Commonly known as:  NEURONTIN     glimepiride 2 MG tablet  Commonly known as:  AMARYL     hydrALAZINE 50 MG tablet  Commonly known as:  APRESOLINE     metFORMIN 500 MG tablet  Commonly known as:  GLUCOPHAGE     niacin 500 MG CR tablet  Commonly known as:  NIASPAN     pravastatin 40 MG tablet  Commonly known as:  PRAVACHOL     rOPINIRole 1 MG tablet  Commonly known as:  REQUIP     traZODone 50 MG tablet  Commonly known as:  DESYREL     valsartan 320 MG tablet  Commonly known as:  DIOVAN     venlafaxine XR 75 MG 24 hr capsule  Commonly known as:  EFFEXOR-XR     vitamin B-12 250 MCG tablet  Commonly known as:  CYANOCOBALAMIN  Take 250 mcg by mouth daily.        Allergies:  Allergies  Allergen Reactions  . Atorvastatin Other (See Comments)    Elevated lft's  . Hydrochlorothiazide Other (See Comments)    Hyponatremia  . Sulfa Antibiotics Other (See Comments)  . Amlodipine Swelling    Family History: Family History  Problem Relation Age of Onset  . Cancer Grandchild     ewing sarcoma    Social History:  reports that she has quit smoking. She does not have any smokeless tobacco history on file. She reports that she does not drink alcohol. Her drug history is not on file.  ROS: UROLOGY Frequent Urination?: Yes Hard to postpone urination?: Yes Burning/pain with urination?: No Get up at night to urinate?: Yes Leakage of urine?: No Urine stream starts and stops?: No Trouble starting stream?: No Do you have to strain to urinate?: Yes Blood in urine?: No Urinary tract infection?: No Sexually transmitted disease?:  No Injury to kidneys or bladder?: No Painful intercourse?: No Weak stream?: No Currently pregnant?: No Vaginal bleeding?: No Last menstrual period?: n  Gastrointestinal Nausea?: No Vomiting?: No Indigestion/heartburn?: No Diarrhea?: No Constipation?: No  Constitutional Fever: No Night sweats?: No Weight loss?: No Fatigue?: No  Skin Skin rash/lesions?: No Itching?: No  Eyes Blurred vision?: No Double vision?: No  Ears/Nose/Throat Sore throat?: No Sinus problems?: No  Hematologic/Lymphatic Swollen glands?: No Easy bruising?: No  Cardiovascular Leg swelling?: No Chest pain?: No  Respiratory Cough?: No Shortness of breath?: No  Endocrine Excessive thirst?: No  Musculoskeletal Back pain?: No Joint pain?: No  Neurological Headaches?: No Dizziness?: No  Psychologic Depression?: No Anxiety?: No  Physical Exam: BP 165/81 mmHg  Pulse 102  Ht 5\' 2"  (1.575 m)  Wt 202 lb 8 oz (91.853 kg)  BMI 37.03 kg/m2  GU:  Atrophic external genitalia.  Normal urethral meatus. No urethral masses and/or tenderness.  Grade 1 cystocele is noted. No bladder fullness or masses. No vaginal lesions or discharge. Normal rectal tone, no masses. Normal anus and perineum.   Laboratory Data: Results for orders placed or performed in visit on 09/06/14  Microscopic Examination  Result Value Ref Range   WBC, UA 11-30 (A) 0 -  5 /hpf   RBC, UA None seen 0 -  2 /hpf   Epithelial Cells (non renal) 0-10 0 - 10 /hpf   Bacteria, UA Few (A) None seen/Few  Urinalysis, Complete  Result Value Ref Range   Specific Gravity, UA <1.005 (L) 1.005 - 1.030   pH, UA 5.5 5.0 - 7.5   Color, UA Yellow Yellow   Appearance Ur Clear Clear   Leukocytes, UA 1+ (A) Negative   Protein, UA Negative Negative/Trace   Glucose, UA Negative Negative   Ketones, UA Negative Negative   RBC, UA Negative Negative   Bilirubin, UA Negative Negative   Urobilinogen, Ur 0.2 0.2 - 1.0 mg/dL   Nitrite, UA Negative  Negative   Microscopic Examination See below:   BLADDER SCAN AMB NON-IMAGING  Result Value Ref Range   Scan Result 106    Lab Results  Component Value Date   WBC 8.9 05/19/2014   HGB 13.1 05/19/2014   HCT 38.9 05/19/2014   MCV 91.0 05/19/2014   PLT 328 05/19/2014    Lab Results  Component Value Date   CREATININE 1.05 01/27/2013    Assessment & Plan:  1. Recurrent UTI:   Patient has a history recurrent UTI controlled with Macrobid every other day.  Today's UA is unremarkable.    - Urinalysis, Complete - BLADDER SCAN AMB NON-IMAGING  2. Urinary frequency:   Patient with urinary frequency having to void every 2 hours.  Her urine dip was negative for glucose.  Her UA was not suspicious for infection.  I will initiate anticholinergic therapy with Vesicare 5 mg daily (#28).  Samples are given to the patient.   I have advised the patient of the side effects of Vesicare, such as: Dry eyes, dry mouth, constipation, mental confusion and/or urinary retention.  She will return in 3 weeks for symptom recheck and PVR.   No Follow-up on file.  Zara Council, [facility] [address], [place], [place] [address] [phone]

## 2014-09-14 ENCOUNTER — Inpatient Hospital Stay: Payer: Medicare PPO | Attending: Oncology

## 2014-09-14 DIAGNOSIS — E119 Type 2 diabetes mellitus without complications: Secondary | ICD-10-CM | POA: Diagnosis not present

## 2014-09-14 DIAGNOSIS — Z87891 Personal history of nicotine dependence: Secondary | ICD-10-CM | POA: Insufficient documentation

## 2014-09-14 DIAGNOSIS — D509 Iron deficiency anemia, unspecified: Secondary | ICD-10-CM | POA: Insufficient documentation

## 2014-09-14 DIAGNOSIS — Z79899 Other long term (current) drug therapy: Secondary | ICD-10-CM | POA: Diagnosis not present

## 2014-09-14 DIAGNOSIS — E780 Pure hypercholesterolemia: Secondary | ICD-10-CM | POA: Insufficient documentation

## 2014-09-14 DIAGNOSIS — I10 Essential (primary) hypertension: Secondary | ICD-10-CM | POA: Diagnosis not present

## 2014-09-14 LAB — CBC WITH DIFFERENTIAL/PLATELET
BASOS PCT: 1 %
Basophils Absolute: 0.1 10*3/uL (ref 0–0.1)
EOS ABS: 0.2 10*3/uL (ref 0–0.7)
Eosinophils Relative: 2 %
HCT: 39.3 % (ref 35.0–47.0)
HEMOGLOBIN: 13 g/dL (ref 12.0–16.0)
Lymphocytes Relative: 27 %
Lymphs Abs: 2.6 10*3/uL (ref 1.0–3.6)
MCH: 30 pg (ref 26.0–34.0)
MCHC: 33.2 g/dL (ref 32.0–36.0)
MCV: 90.3 fL (ref 80.0–100.0)
Monocytes Absolute: 1 10*3/uL — ABNORMAL HIGH (ref 0.2–0.9)
Monocytes Relative: 11 %
NEUTROS PCT: 59 %
Neutro Abs: 5.7 10*3/uL (ref 1.4–6.5)
Platelets: 290 10*3/uL (ref 150–440)
RBC: 4.35 MIL/uL (ref 3.80–5.20)
RDW: 14.1 % (ref 11.5–14.5)
WBC: 9.6 10*3/uL (ref 3.6–11.0)

## 2014-09-14 LAB — FERRITIN: Ferritin: 33 ng/mL (ref 11–307)

## 2014-09-14 LAB — IRON AND TIBC
Iron: 44 ug/dL (ref 28–170)
SATURATION RATIOS: 12 % (ref 10.4–31.8)
TIBC: 380 ug/dL (ref 250–450)
UIBC: 336 ug/dL

## 2014-09-17 ENCOUNTER — Inpatient Hospital Stay: Payer: Medicare PPO

## 2014-09-17 ENCOUNTER — Inpatient Hospital Stay (HOSPITAL_BASED_OUTPATIENT_CLINIC_OR_DEPARTMENT_OTHER): Payer: Medicare PPO | Admitting: Oncology

## 2014-09-17 VITALS — BP 173/80 | HR 94 | Temp 97.7°F | Resp 18

## 2014-09-17 DIAGNOSIS — D509 Iron deficiency anemia, unspecified: Secondary | ICD-10-CM | POA: Diagnosis not present

## 2014-09-17 DIAGNOSIS — Z79899 Other long term (current) drug therapy: Secondary | ICD-10-CM

## 2014-09-17 NOTE — Progress Notes (Signed)
Jenna Olson  Telephone:(336) [phone] Fax:(336) [phone]  ID: Jenna Olson OB: [birth year]  MR#: [id number]  [id number]  Patient Care Team: Ezequiel Kayser, MD as PCP - General (Internal Medicine)  CHIEF COMPLAINT:  Chief Complaint  Patient presents with  . Anemia   INTERVAL HISTORY: Patient returns to clinic today for repeat laboratory work and further evaluation. She currently feels well and is asymptomatic. She does not complain of weakness or fatigue today. She denies any shortness of breath, dyspnea on exertion, chest pain, or cough. She denies any melena or hematochezia. She has had no fever, chills, night sweats, or weight loss.  She offers no specific complaints today.  REVIEW OF SYSTEMS:   Review of Systems  Constitutional: Negative.  Negative for malaise/fatigue.  Respiratory: Negative.   Musculoskeletal: Negative.   Neurological: Negative.  Negative for weakness.    As per HPI. Otherwise, a complete review of systems is negatve.  PAST MEDICAL HISTORY: Past Medical History  Diagnosis Date  . Diabetes mellitus without complication   . Hypertension   . High cholesterol   . Urinary frequency   . UTI (lower urinary tract infection)   . Dysuria   . Incontinence   . Urinary urgency   . Proteinuria     PAST SURGICAL HISTORY: Past Surgical History  Procedure Laterality Date  . Partial hysterectomy    . Cholecystectomy      FAMILY HISTORY Family History  Problem Relation Age of Onset  . Cancer Grandchild     ewing sarcoma       ADVANCED DIRECTIVES:    HEALTH MAINTENANCE: Social History  Substance Use Topics  . Smoking status: Former Research scientist (life sciences)  . Smokeless tobacco: Not on file     Comment: quit 15 years  . Alcohol Use: No     Colonoscopy:  PAP:  Bone density:  Lipid panel:  Allergies  Allergen Reactions  . Atorvastatin Other (See Comments)    Elevated lft's  . Hydrochlorothiazide Other (See Comments)    Hyponatremia    . Sulfa Antibiotics Other (See Comments)  . Amlodipine Swelling    Current Outpatient Prescriptions  Medication Sig Dispense Refill  . acetaminophen (TYLENOL) 500 MG tablet Take 500 mg by mouth every 6 (six) hours as needed.    . AMITIZA 24 MCG capsule     . aspirin 81 MG tablet Take 81 mg by mouth daily.    Marland Kitchen azelastine (ASTELIN) 0.1 % nasal spray Place 2 sprays into both nostrils 2 (two) times daily. Use in each nostril as directed    . cholecalciferol (VITAMIN D) 400 UNITS TABS tablet Take 400 Units by mouth daily.    . diphenhydrAMINE (SOMINEX) 25 MG tablet Take 25 mg by mouth at bedtime as needed for sleep.    Marland Kitchen esomeprazole (NEXIUM) 40 MG capsule     . fexofenadine (ALLEGRA) 180 MG tablet Take 180 mg by mouth daily.    . fluticasone (FLONASE) 50 MCG/ACT nasal spray     . furosemide (LASIX) 20 MG tablet     . gabapentin (NEURONTIN) 800 MG tablet 400 mg at bedtime.     Marland Kitchen glimepiride (AMARYL) 2 MG tablet     . hydrALAZINE (APRESOLINE) 50 MG tablet     . metFORMIN (GLUCOPHAGE) 500 MG tablet     . niacin (NIASPAN) 500 MG CR tablet     . pravastatin (PRAVACHOL) 40 MG tablet     . rOPINIRole (REQUIP) 1 MG tablet     .  traZODone (DESYREL) 50 MG tablet     . valsartan (DIOVAN) 320 MG tablet     . venlafaxine XR (EFFEXOR-XR) 75 MG 24 hr capsule     . vitamin B-12 (CYANOCOBALAMIN) 250 MCG tablet Take 250 mcg by mouth daily.     No current facility-administered medications for this visit.    OBJECTIVE: Filed Vitals:   09/17/14 1213  BP: 173/80  Pulse: 94  Temp: 97.7 F (36.5 C)  Resp: 18     There is no weight on file to calculate BMI.    ECOG FS:0 - Asymptomatic  General: Well-developed, well-nourished, no acute distress. Eyes: anicteric sclera. Lungs: Clear to auscultation bilaterally. Heart: Regular rate and rhythm. No rubs, murmurs, or gallops. Abdomen: Soft, nontender, nondistended. No organomegaly noted, normoactive bowel sounds. Musculoskeletal: No edema, cyanosis, or  clubbing. Neuro: Alert, answering all questions appropriately. Cranial nerves grossly intact. Skin: No rashes or petechiae noted. Psych: Normal affect.    LAB RESULTS:  Lab Results  Component Value Date   NA 136 01/27/2013   K 4.0 01/27/2013   CL 100 01/27/2013   CO2 30 01/27/2013   GLUCOSE 150* 01/27/2013   BUN 13 01/27/2013   CREATININE 1.05 01/27/2013   CALCIUM 9.3 01/27/2013   GFRNONAA 55* 01/27/2013   GFRAA >60 01/27/2013    Lab Results  Component Value Date   WBC 9.6 09/14/2014   NEUTROABS 5.7 09/14/2014   HGB 13.0 09/14/2014   HCT 39.3 09/14/2014   MCV 90.3 09/14/2014   PLT 290 09/14/2014     STUDIES: No results found.  ASSESSMENT: Iron-deficiency anemia.  PLAN:    1.  Anemia:  Patient's hemoglobin and iron stores are now within normal limits. She last received Feraheme in February 2016. No intervention is needed at this time. Patient will then return to clinic in 4 months with repeat laboratory work, further evaluation, and consideration of additional IV iron.  Previously, colonoscopy, EGD, and capsule endoscopy in 2007 did not reveal an etiology of her iron deficiency anemia.   She has been instructed to continue her oral iron supplementation as prescribed.   Patient expressed understanding and was in agreement with this plan. She also understands that She can call clinic at any time with any questions, concerns, or complaints.    Lloyd Huger, MD   09/17/2014 1:39 PM

## 2014-09-24 ENCOUNTER — Ambulatory Visit: Payer: Medicare PPO | Admitting: Oncology

## 2014-09-28 ENCOUNTER — Encounter: Payer: Self-pay | Admitting: Urology

## 2014-09-28 ENCOUNTER — Ambulatory Visit (INDEPENDENT_AMBULATORY_CARE_PROVIDER_SITE_OTHER): Payer: Medicare PPO | Admitting: Urology

## 2014-09-28 VITALS — BP 154/75 | HR 89 | Ht 61.25 in | Wt 203.8 lb

## 2014-09-28 DIAGNOSIS — R350 Frequency of micturition: Secondary | ICD-10-CM

## 2014-09-28 DIAGNOSIS — N390 Urinary tract infection, site not specified: Secondary | ICD-10-CM | POA: Diagnosis not present

## 2014-09-28 LAB — BLADDER SCAN AMB NON-IMAGING: Scan Result: 0

## 2014-09-28 MED ORDER — SOLIFENACIN SUCCINATE 5 MG PO TABS: 5.0000 mg | ORAL_TABLET | Freq: Every day | ORAL | Status: DC

## 2014-09-28 NOTE — Progress Notes (Signed)
09/28/2014 2:10 PM   [place] 1945-12-03 [id number]  Referring provider: Ezequiel Kayser, MD [place], [place] [id number]  Chief Complaint  Patient presents with  . Urinary Frequency    3 week follow up    HPI: Patient is a 69 year old white female who was started on Vesicare 5 mg daily at her last visit for symptoms of increasing frequency, urinary urgency, nocturia and straining to urinate.  She states she has noticed her symptoms improving the longer she stays on the medication.  She is not experiencing any untoward side effects.  Her PVR is 0 mL.    She is not experiencing any dysuria, hematuria or suprapubic pain.    PMH: Past Medical History  Diagnosis Date  . Diabetes mellitus without complication   . Hypertension   . High cholesterol   . Urinary frequency   . UTI (lower urinary tract infection)   . Dysuria   . Incontinence   . Urinary urgency   . Proteinuria     Surgical History: Past Surgical History  Procedure Laterality Date  . Partial hysterectomy    . Cholecystectomy      Home Medications:    Medication List       This list is accurate as of: 09/28/14  2:10 PM.  Always use your most recent med list.               acetaminophen 500 MG tablet  Commonly known as:  TYLENOL  Take 500 mg by mouth every 6 (six) hours as needed.     AMITIZA 24 MCG capsule  Generic drug:  lubiprostone     aspirin 81 MG tablet  Take 81 mg by mouth daily.     azelastine 0.1 % nasal spray  Commonly known as:  ASTELIN  Place 2 sprays into both nostrils 2 (two) times daily. Use in each nostril as directed     cholecalciferol 400 UNITS Tabs tablet  Commonly known as:  VITAMIN D  Take 400 Units by mouth daily.     diphenhydrAMINE 25 MG tablet  Commonly known as:  SOMINEX  Take 25 mg by mouth at bedtime as needed for sleep.     esomeprazole 40 MG capsule  Commonly known as:  NEXIUM     fexofenadine 180 MG tablet  Commonly known as:   ALLEGRA  Take 180 mg by mouth daily.     fluticasone 50 MCG/ACT nasal spray  Commonly known as:  FLONASE     furosemide 20 MG tablet  Commonly known as:  LASIX     gabapentin 800 MG tablet  Commonly known as:  NEURONTIN  400 mg at bedtime.     glimepiride 2 MG tablet  Commonly known as:  AMARYL     hydrALAZINE 50 MG tablet  Commonly known as:  APRESOLINE     metFORMIN 500 MG tablet  Commonly known as:  GLUCOPHAGE     niacin 500 MG CR tablet  Commonly known as:  NIASPAN     pravastatin 40 MG tablet  Commonly known as:  PRAVACHOL     PROAIR HFA 108 (90 BASE) MCG/ACT inhaler  Generic drug:  albuterol  Inhale into the lungs.     rOPINIRole 1 MG tablet  Commonly known as:  REQUIP     solifenacin 5 MG tablet  Commonly known as:  VESICARE  Take 1 tablet (5 mg total) by mouth daily.     traZODone 50  MG tablet  Commonly known as:  DESYREL     valsartan 320 MG tablet  Commonly known as:  DIOVAN     venlafaxine XR 75 MG 24 hr capsule  Commonly known as:  EFFEXOR-XR     vitamin B-12 250 MCG tablet  Commonly known as:  CYANOCOBALAMIN  Take 250 mcg by mouth daily.        Allergies:  Allergies  Allergen Reactions  . Atorvastatin Other (See Comments)    Elevated lft's  . Hydrochlorothiazide Other (See Comments)    Hyponatremia  . Sulfa Antibiotics Other (See Comments)  . Amlodipine Swelling    Family History: Family History  Problem Relation Age of Onset  . Cancer Grandchild     ewing sarcoma    Social History:  reports that she has quit smoking. She does not have any smokeless tobacco history on file. She reports that she does not drink alcohol or use illicit drugs.  ROS: UROLOGY Frequent Urination?: Yes Hard to postpone urination?: Yes Burning/pain with urination?: No Get up at night to urinate?: Yes Leakage of urine?: No Urine stream starts and stops?: No Trouble starting stream?: No Do you have to strain to urinate?: Yes Blood in urine?:  No Urinary tract infection?: No Sexually transmitted disease?: No Injury to kidneys or bladder?: No Painful intercourse?: No Weak stream?: No Currently pregnant?: No Vaginal bleeding?: No Last menstrual period?: n  Gastrointestinal Nausea?: No Vomiting?: No Indigestion/heartburn?: No Diarrhea?: No Constipation?: No  Constitutional Fever: No Night sweats?: No Weight loss?: No Fatigue?: Yes  Skin Skin rash/lesions?: No Itching?: No  Eyes Blurred vision?: No Double vision?: No  Ears/Nose/Throat Sore throat?: No Sinus problems?: No  Hematologic/Lymphatic Swollen glands?: No Easy bruising?: No  Cardiovascular Leg swelling?: Yes Chest pain?: No  Respiratory Cough?: No Shortness of breath?: No  Endocrine Excessive thirst?: No  Musculoskeletal Back pain?: No Joint pain?: No  Neurological Headaches?: No Dizziness?: No  Psychologic Depression?: No Anxiety?: No  Physical Exam: BP 154/75 mmHg  Pulse 89  Ht 5' 1.25" (1.556 m)  Wt 203 lb 12.8 oz (92.443 kg)  BMI 38.18 kg/m2   Laboratory Data: Lab Results  Component Value Date   WBC 9.6 09/14/2014   HGB 13.0 09/14/2014   HCT 39.3 09/14/2014   MCV 90.3 09/14/2014   PLT 290 09/14/2014    Lab Results  Component Value Date   CREATININE 1.05 01/27/2013    Urinalysis    Component Value Date/Time   GLUCOSEU Negative 09/06/2014 1616   BILIRUBINUR Negative 09/06/2014 1616   NITRITE Negative 09/06/2014 1616   LEUKOCYTESUR 1+* 09/06/2014 1616    Pertinent Imaging: Results for KATIUSKA, MALINSKI [facility] (MRN [id number]) as of 09/28/2014 14:06  Ref. Range 09/28/2014 13:48  Scan Result Unknown 0    Assessment & Plan:    1. Recurrent UTI: Patient has a history recurrent UTI controlled with Macrobid every other day. Today's UA is unremarkable.   2. Urinary frequency: Patient is noticing improvement in her urinary symptoms with the Vesicare.  She would like to continue the medication.  I will send  in a prescription for the patient.  I also gave her samples (#28) to cover her if her insurance will require a prior authorization.    - BLADDER SCAN AMB NON-IMAGING  Return in about 3 months (around 12/28/2014) for PVR.  Zara Council, [facility] [address] [place], [place] [address] [phone]

## 2014-10-28 IMAGING — CT CT CHEST W/O CM
2 of 3 series · 15 of 36 positions shown, 18 images · non-contrast
Comparison: CT chest dated 11/20/2012 and 05/29/2012

CLINICAL DATA: Follow-up pulmonary nodule

EXAM:
CT CHEST WITHOUT CONTRAST
TECHNIQUE: Multidetector CT imaging of the chest was performed following the
standard protocol without IV contrast..

[Series 2: soft tissue · axial · 0.63mm/px · z∈[-622,-347]mm · 12 of 65 slices shown, 15 images]
[im 5/65  mediastinal]
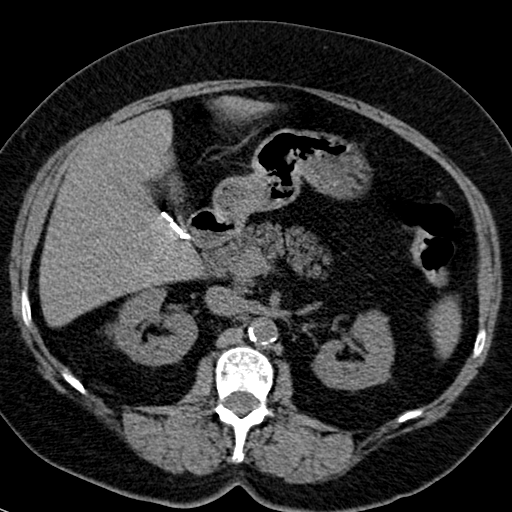
[im 5/65  lung]
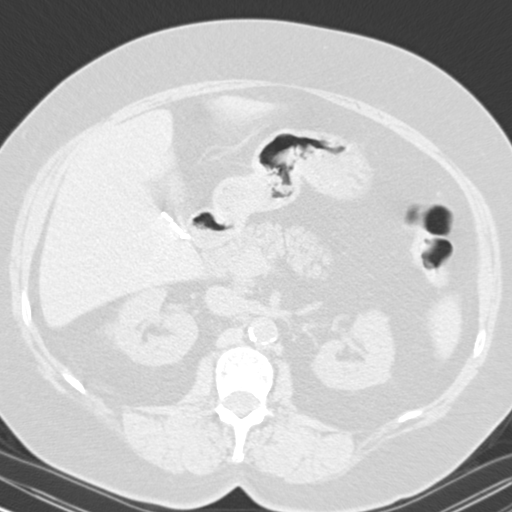
[im 10/65  lung]
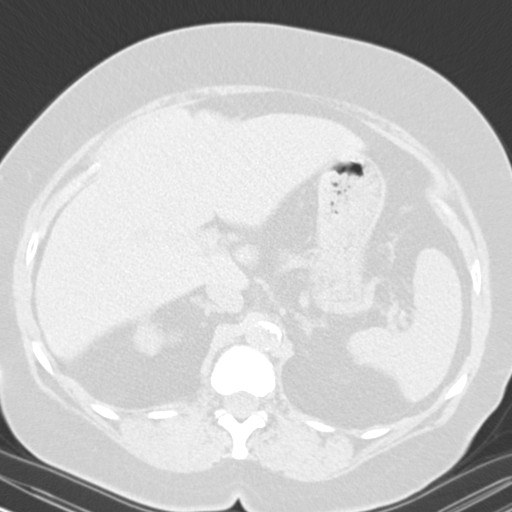
[im 15/65  lung]
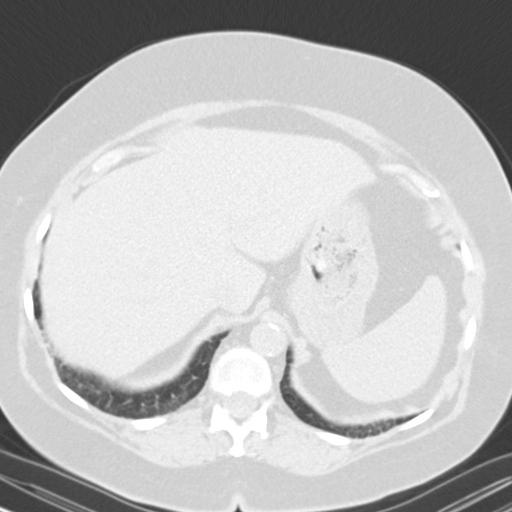
[im 19/65  lung]
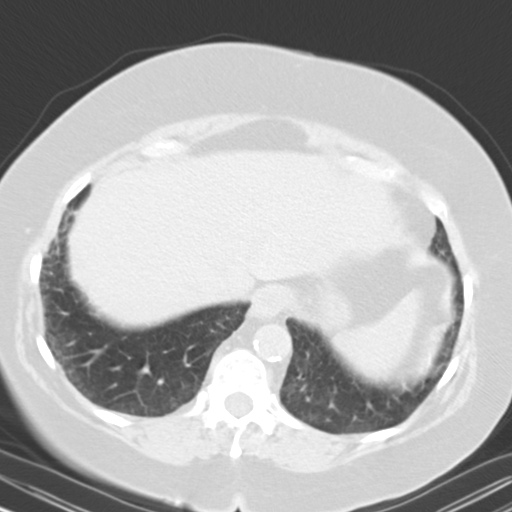
[im 24/65  mediastinal]
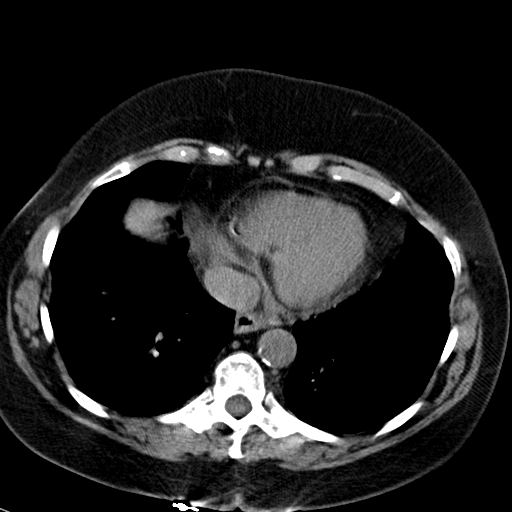
[im 24/65  lung]
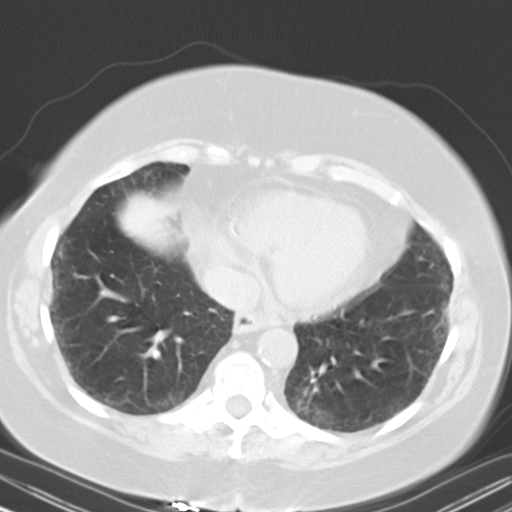
[im 29/65  lung]
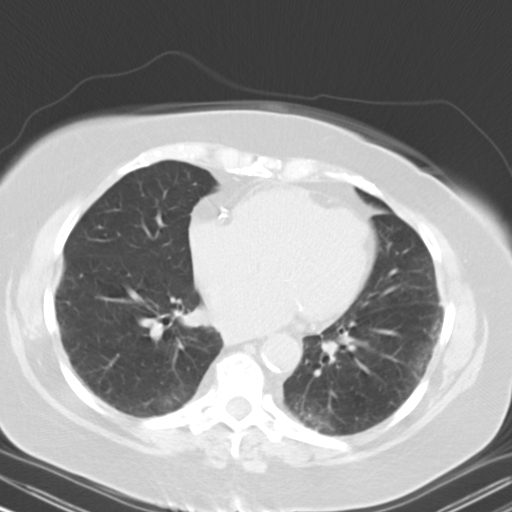
[im 36/65  lung]
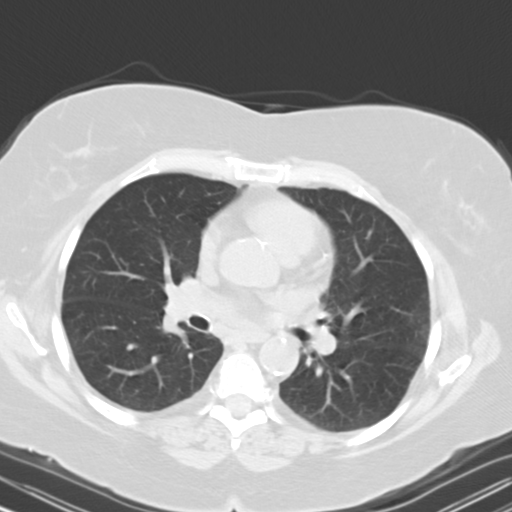
[im 41/65  lung]
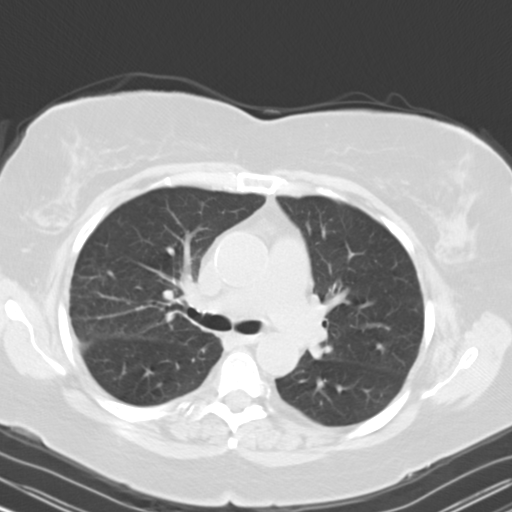
[im 46/65  mediastinal]
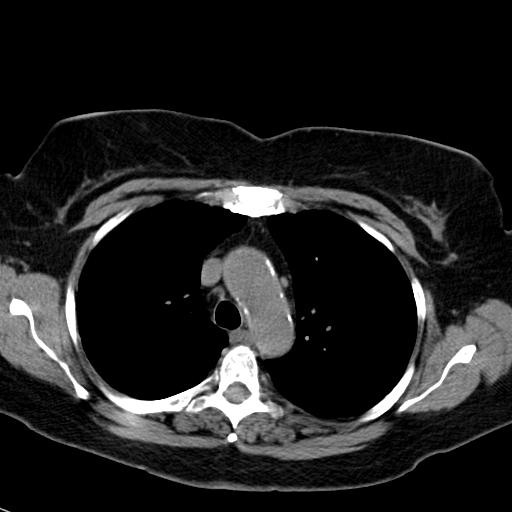
[im 46/65  lung]
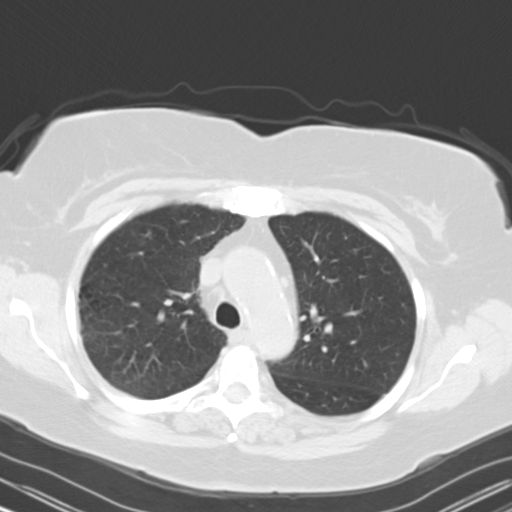
[im 50/65  lung]
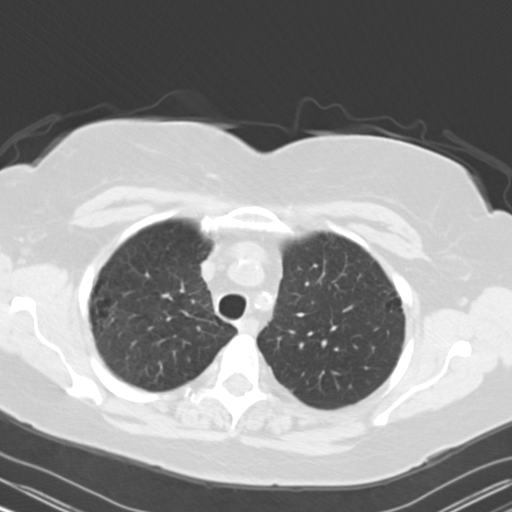
[im 55/65  lung]
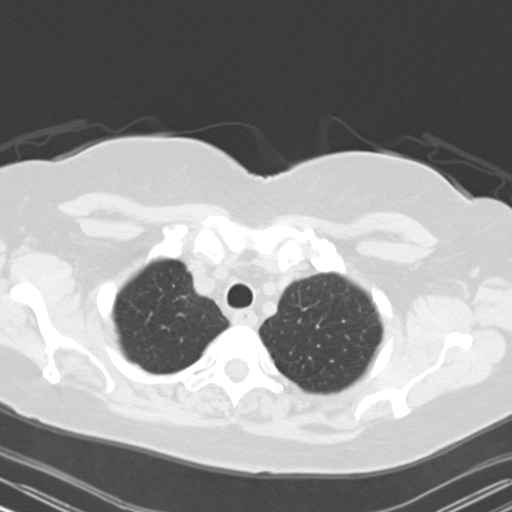
[im 60/65  lung]
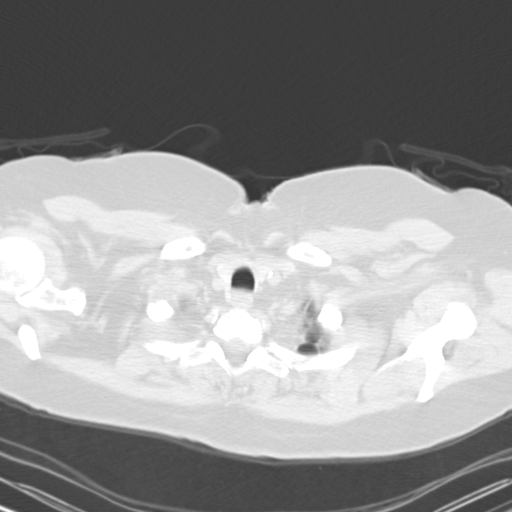

[Series 602: coronal · coronal · 0.63mm/px · 3 of 121 slices shown]
[im 25/121  lung]
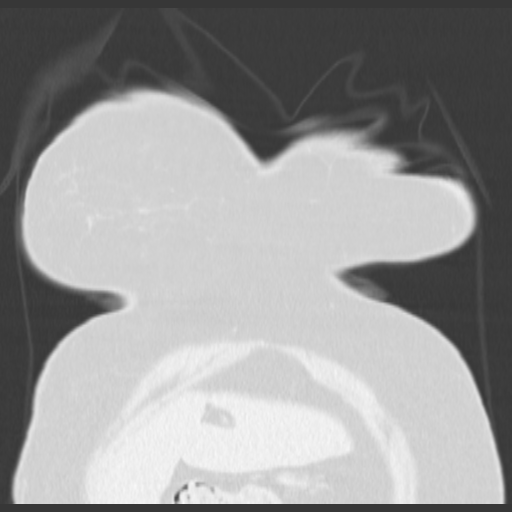
[im 49/121  lung]
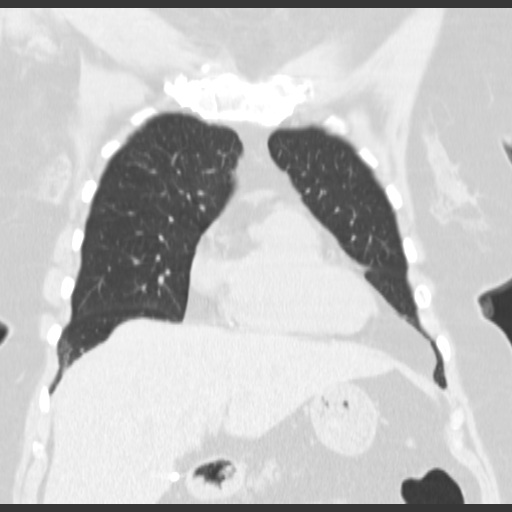
[im 73/121  lung]
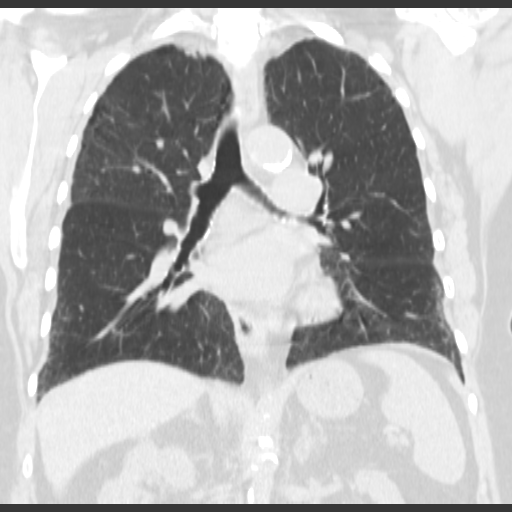

[15 of 36 positions shown; findings below may reference images not displayed]

FINDINGS: 3 mm right upper lobe pulmonary nodules (series 3/ images 17-18),
unchanged from 05/29/2012, benign given 1 year stability.

No new/suspicious pulmonary nodules. Mild paraseptal and
centrilobular emphysematous changes. Superimposed mild subpleural
reticulation/fibrosis in the bilateral lower lobes, raising the
possibility of mild interstitial lung disease. No pleural effusion
or pneumothorax.

Visualized thyroid is unremarkable.

The heart is normal in size. No pericardial effusion. Coronary
atherosclerosis. Atherosclerotic calcifications of the aortic arch.

No suspicious mediastinal or axillary lymphadenopathy.

Visualized upper abdomen the is notable for cholecystectomy clips
and vascular calcifications.

Mild degenerative changes of the visualized thoracolumbar spine.
IMPRESSION: Two 3 mm right upper lobe pulmonary nodules, unchanged from 7300,
benign given 1 year stability.

No dedicated follow-up imaging is required per [HOSPITAL]
guidelines.

This recommendation follows the consensus statement: Guidelines for
Management of Small Pulmonary Nodules Detected on CT Scans: A
Statement from the [HOSPITAL] as published in Radiology

## 2014-11-15 ENCOUNTER — Telehealth: Payer: Self-pay | Admitting: Urology

## 2014-11-15 NOTE — Telephone Encounter (Signed)
-----   Message from Jarvis Morgan sent at 11/08/2014  2:21 PM EDT ----- Regarding: Results Urine culture

## 2014-11-15 NOTE — Telephone Encounter (Signed)
Patient will need to have a recheck on her UA.  She will need to have a CATH UA with culture.

## 2014-12-28 ENCOUNTER — Ambulatory Visit (INDEPENDENT_AMBULATORY_CARE_PROVIDER_SITE_OTHER): Payer: Medicare PPO | Admitting: Urology

## 2014-12-28 ENCOUNTER — Encounter: Payer: Self-pay | Admitting: Urology

## 2014-12-28 VITALS — BP 157/82 | HR 86 | Ht 62.0 in | Wt 203.5 lb

## 2014-12-28 DIAGNOSIS — N3000 Acute cystitis without hematuria: Secondary | ICD-10-CM

## 2014-12-28 DIAGNOSIS — R350 Frequency of micturition: Secondary | ICD-10-CM

## 2014-12-28 LAB — URINALYSIS, COMPLETE
BILIRUBIN UA: NEGATIVE
NITRITE UA: NEGATIVE
RBC UA: NEGATIVE
SPEC GRAV UA: 1.015 (ref 1.005–1.030)
UUROB: 1 mg/dL (ref 0.2–1.0)
pH, UA: 7 (ref 5.0–7.5)

## 2014-12-28 LAB — MICROSCOPIC EXAMINATION: RBC MICROSCOPIC, UA: NONE SEEN /HPF (ref 0–?)

## 2014-12-28 LAB — BLADDER SCAN AMB NON-IMAGING: Scan Result: 39

## 2014-12-28 MED ORDER — SOLIFENACIN SUCCINATE 5 MG PO TABS: 5.0000 mg | ORAL_TABLET | Freq: Every day | ORAL | Status: DC

## 2014-12-28 NOTE — Progress Notes (Signed)
2:10 PM   [place] October 26, 1945 [id number]  Referring provider: Ezequiel Kayser, MD [facility] [place], [place] [address]  Chief Complaint  Patient presents with  . Urinary Frequency    follow up    HPI: Patient is a 69 year old Caucasian female who presents today for a three month follow up for urinary frequency and an UTI.  Urinary frequency Patient has been having success with the Vesicare 5 mg daily.  It does increase her dry mouth, but she does not want to try Myrbetriq due to BP issues.  Her PVR is 39 mL.    UTI Patient was having suprapubic pressure two months ago.  She had an appointment with her PCP, Dr. Raechel Ache, and he sent her urine for culture.  It returned positive for E. Coli and she was treated with an antibiotic.  Her symptoms have abated.  She is not experiencing any dysuria, hematuria or suprapubic pain.  She did not have any associated fevers, chills, nausea or vomiting.   PMH: Past Medical History  Diagnosis Date  . Diabetes mellitus without complication ([place])   . Hypertension   . High cholesterol   . Urinary frequency   . UTI (lower urinary tract infection)   . Dysuria   . Incontinence   . Urinary urgency   . Proteinuria     Surgical History: Past Surgical History  Procedure Laterality Date  . Partial hysterectomy    . Cholecystectomy    . Medial partial knee replacement Right     Home Medications:    Medication List       This list is accurate as of: 12/28/14  2:10 PM.  Always use your most recent med list.               acetaminophen 500 MG tablet  Commonly known as:  TYLENOL  Take 500 mg by mouth every 6 (six) hours as needed.     AMITIZA 24 MCG capsule  Generic drug:  lubiprostone     aspirin 81 MG tablet  Take 81 mg by mouth daily.     azelastine 0.1 % nasal spray  Commonly known as:  ASTELIN  Place 2 sprays into both nostrils 2 (two) times daily. Use in each nostril as directed     cefUROXime 250 MG tablet  Commonly known as:  CEFTIN  Reported on 12/28/2014     cholecalciferol 400 UNITS Tabs tablet  Commonly known as:  VITAMIN D  Take 400 Units by mouth daily.     diphenhydrAMINE 25 MG tablet  Commonly known as:  SOMINEX  Take 25 mg by mouth at bedtime as needed for sleep.     diphenhydrAMINE 25 MG tablet  Commonly known as:  BENADRYL  Take by mouth.     esomeprazole 40 MG capsule  Commonly known as:  NEXIUM     ferrous sulfate 325 (65 FE) MG tablet  Take 325 mg by mouth daily with breakfast.     fexofenadine 180 MG tablet  Commonly known as:  ALLEGRA  Take 180 mg by mouth daily.     fluticasone 50 MCG/ACT nasal spray  Commonly known as:  FLONASE     furosemide 20 MG tablet  Commonly known as:  LASIX     gabapentin 800 MG tablet  Commonly known as:  NEURONTIN  800 mg at bedtime.     glimepiride 2 MG tablet  Commonly known as:  AMARYL  hydrALAZINE 50 MG tablet  Commonly known as:  APRESOLINE     HYDROcodone-acetaminophen 5-325 MG tablet  Commonly known as:  NORCO/VICODIN  Take by mouth.     metFORMIN 500 MG tablet  Commonly known as:  GLUCOPHAGE     niacin 500 MG CR tablet  Commonly known as:  NIASPAN     pravastatin 40 MG tablet  Commonly known as:  PRAVACHOL     PROAIR HFA 108 (90 BASE) MCG/ACT inhaler  Generic drug:  albuterol  Inhale into the lungs.     rOPINIRole 1 MG tablet  Commonly known as:  REQUIP     solifenacin 5 MG tablet  Commonly known as:  VESICARE  Take 1 tablet (5 mg total) by mouth daily.     traZODone 50 MG tablet  Commonly known as:  DESYREL     valsartan 320 MG tablet  Commonly known as:  DIOVAN     venlafaxine XR 75 MG 24 hr capsule  Commonly known as:  EFFEXOR-XR     vitamin B-12 250 MCG tablet  Commonly known as:  CYANOCOBALAMIN  Take 250 mcg by mouth daily.        Allergies:  Allergies  Allergen Reactions  . Atorvastatin Other (See Comments)    Elevated lft's  .  Hydrochlorothiazide Other (See Comments)    Hyponatremia  . Sulfa Antibiotics Other (See Comments)  . Amlodipine Swelling    Family History: Family History  Problem Relation Age of Onset  . Cancer Grandchild     ewing sarcoma  . Kidney disease Neg Hx   . Bladder Cancer Maternal Uncle   . Prostate cancer Maternal Uncle     Social History:  reports that she has quit smoking. She does not have any smokeless tobacco history on file. She reports that she does not drink alcohol or use illicit drugs.  ROS: UROLOGY Frequent Urination?: No Hard to postpone urination?: No Burning/pain with urination?: No Get up at night to urinate?: No Leakage of urine?: No Urine stream starts and stops?: No Trouble starting stream?: No Do you have to strain to urinate?: No Blood in urine?: No Urinary tract infection?: No Sexually transmitted disease?: No Injury to kidneys or bladder?: No Painful intercourse?: No Weak stream?: No Currently pregnant?: No Vaginal bleeding?: No Last menstrual period?: n  Gastrointestinal Nausea?: No Vomiting?: No Indigestion/heartburn?: No Diarrhea?: No Constipation?: No  Constitutional Fever: No Night sweats?: No Weight loss?: No Fatigue?: No  Skin Skin rash/lesions?: No Itching?: No  Eyes Blurred vision?: No Double vision?: No  Ears/Nose/Throat Sore throat?: No Sinus problems?: No  Hematologic/Lymphatic Swollen glands?: No Easy bruising?: No  Cardiovascular Leg swelling?: No Chest pain?: No  Respiratory Cough?: No Shortness of breath?: No  Endocrine Excessive thirst?: No  Musculoskeletal Back pain?: No Joint pain?: No  Neurological Headaches?: No Dizziness?: No  Psychologic Depression?: No Anxiety?: No  Physical Exam: BP 157/82 mmHg  Pulse 86  Ht 5\' 2"  (1.575 m)  Wt 203 lb 8 oz (92.307 kg)  BMI 37.21 kg/m2   Laboratory Data: Lab Results  Component Value Date   WBC 9.6 09/14/2014   HGB 13.0 09/14/2014   HCT  39.3 09/14/2014   MCV 90.3 09/14/2014   PLT 290 09/14/2014    Lab Results  Component Value Date   CREATININE 1.05 01/27/2013    Urinalysis Results for orders placed or performed in visit on 12/28/14  Microscopic Examination  Result Value Ref Range   WBC, UA 0-5 0 -  5 /hpf   RBC, UA None seen 0 -  2 /hpf   Epithelial Cells (non renal) 0-10 0 - 10 /hpf   Renal Epithel, UA 0-10 (A) None seen /hpf   Bacteria, UA Few (A) None seen/Few  Urinalysis, Complete  Result Value Ref Range   Specific Gravity, UA 1.015 1.005 - 1.030   pH, UA 7.0 5.0 - 7.5   Color, UA Yellow Yellow   Appearance Ur Clear Clear   Leukocytes, UA Trace (A) Negative   Protein, UA 2+ (A) Negative/Trace   Glucose, UA 1+ (A) Negative   Ketones, UA 1+ (A) Negative   RBC, UA Negative Negative   Bilirubin, UA Negative Negative   Urobilinogen, Ur 1.0 0.2 - 1.0 mg/dL   Nitrite, UA Negative Negative   Microscopic Examination See below:   BLADDER SCAN AMB NON-IMAGING  Result Value Ref Range   Scan Result 39     Pertinent Imaging: Results for TYLEISHA, SHAH [facility] (MRN [id number]) as of 09/28/2014 14:06  Ref. Range 09/28/2014 13:48  Scan Result Unknown 0   Results for SAJE, SWEARINGEN [facility] (MRN [id number]) as of 12/28/2014 14:02  Ref. Range 12/28/2014 13:50  Scan Result Unknown 39   Assessment & Plan:    1. UTI: Patient had a positive urine culture for E.coli in October.  Today's UA is unremarkable. She is asymptomatic.  She will contact us for any symptoms of an UTI.    2. Urinary frequency: Patient is noticing improvement in her urinary symptoms with the Vesicare.  She would like to continue the medication, in spite of the dry mouth.   I will send in a prescription for the patient.  She will RTC in one year for PVR and office visit.  - BLADDER SCAN AMB NON-IMAGING  Return in about 1 year (around 12/28/2015) for PVR .  Zara Council, [facility] [address] [place] [employer], [place] [address] [phone]

## 2015-01-19 ENCOUNTER — Inpatient Hospital Stay: Payer: Medicare PPO | Attending: Oncology

## 2015-01-19 DIAGNOSIS — Z7984 Long term (current) use of oral hypoglycemic drugs: Secondary | ICD-10-CM | POA: Diagnosis not present

## 2015-01-19 DIAGNOSIS — Z7982 Long term (current) use of aspirin: Secondary | ICD-10-CM | POA: Diagnosis not present

## 2015-01-19 DIAGNOSIS — K5900 Constipation, unspecified: Secondary | ICD-10-CM | POA: Diagnosis not present

## 2015-01-19 DIAGNOSIS — E7800 Pure hypercholesterolemia, unspecified: Secondary | ICD-10-CM | POA: Diagnosis not present

## 2015-01-19 DIAGNOSIS — I10 Essential (primary) hypertension: Secondary | ICD-10-CM | POA: Insufficient documentation

## 2015-01-19 DIAGNOSIS — Z87891 Personal history of nicotine dependence: Secondary | ICD-10-CM | POA: Insufficient documentation

## 2015-01-19 DIAGNOSIS — D509 Iron deficiency anemia, unspecified: Secondary | ICD-10-CM | POA: Insufficient documentation

## 2015-01-19 DIAGNOSIS — Z79899 Other long term (current) drug therapy: Secondary | ICD-10-CM | POA: Diagnosis not present

## 2015-01-19 DIAGNOSIS — E119 Type 2 diabetes mellitus without complications: Secondary | ICD-10-CM | POA: Insufficient documentation

## 2015-01-19 LAB — CBC WITH DIFFERENTIAL/PLATELET
Basophils Absolute: 0.1 10*3/uL (ref 0–0.1)
Basophils Relative: 1 %
EOS ABS: 0.6 10*3/uL (ref 0–0.7)
EOS PCT: 6 %
HCT: 40.5 % (ref 35.0–47.0)
HEMOGLOBIN: 13.3 g/dL (ref 12.0–16.0)
LYMPHS PCT: 23 %
Lymphs Abs: 2.1 10*3/uL (ref 1.0–3.6)
MCH: 28.7 pg (ref 26.0–34.0)
MCHC: 32.8 g/dL (ref 32.0–36.0)
MCV: 87.4 fL (ref 80.0–100.0)
MONO ABS: 0.8 10*3/uL (ref 0.2–0.9)
MONOS PCT: 8 %
Neutro Abs: 5.8 10*3/uL (ref 1.4–6.5)
Neutrophils Relative %: 62 %
Platelets: 304 10*3/uL (ref 150–440)
RBC: 4.64 MIL/uL (ref 3.80–5.20)
RDW: 15.4 % — AB (ref 11.5–14.5)
WBC: 9.3 10*3/uL (ref 3.6–11.0)

## 2015-01-19 LAB — IRON AND TIBC
Iron: 102 ug/dL (ref 28–170)
Saturation Ratios: 29 % (ref 10.4–31.8)
TIBC: 358 ug/dL (ref 250–450)
UIBC: 256 ug/dL

## 2015-01-19 LAB — FERRITIN: FERRITIN: 26 ng/mL (ref 11–307)

## 2015-01-21 ENCOUNTER — Inpatient Hospital Stay: Payer: Medicare PPO

## 2015-01-21 ENCOUNTER — Inpatient Hospital Stay (HOSPITAL_BASED_OUTPATIENT_CLINIC_OR_DEPARTMENT_OTHER): Payer: Medicare PPO | Admitting: Oncology

## 2015-01-21 VITALS — BP 173/84 | HR 89 | Temp 97.6°F | Wt 203.0 lb

## 2015-01-21 DIAGNOSIS — K5900 Constipation, unspecified: Secondary | ICD-10-CM | POA: Diagnosis not present

## 2015-01-21 DIAGNOSIS — D509 Iron deficiency anemia, unspecified: Secondary | ICD-10-CM

## 2015-01-21 DIAGNOSIS — Z79899 Other long term (current) drug therapy: Secondary | ICD-10-CM

## 2015-01-21 NOTE — Progress Notes (Signed)
Pt here today for IDA follow up. Reports low energy. Stools are dark and tarry d/t oral iron that she takes daily. Pt does not get hungry, eats 1 meal per day. Denies dizziness. Chronic constipation, she takes prescription medicine to help with this problem.

## 2015-01-21 NOTE — Progress Notes (Signed)
[place]  Telephone:(336) [phone] Fax:(336) [phone]  ID: Duanne Limerick OB: 02-01-1945  MR#: [id number]  [id number]  Patient Care Team: Ezequiel Kayser, MD as PCP - General (Internal Medicine)  CHIEF COMPLAINT:  Chief Complaint  Patient presents with  . Follow-up    IDA evaluation   INTERVAL HISTORY: Patient returns to clinic today for repeat laboratory work and further evaluation. She continues to complain of persistent weakness and fatigue. She also has constipation and black tarry stools related to her oral iron supplementation. She otherwise feels well. She denies any shortness of breath, dyspnea on exertion, chest pain, or cough. She denies any melena or hematochezia. She has had no fever, chills, night sweats, or weight loss.  She offers no further specific complaints today.  REVIEW OF SYSTEMS:   Review of Systems  Constitutional: Positive for malaise/fatigue. Negative for fever and weight loss.  Respiratory: Negative.   Cardiovascular: Negative.   Gastrointestinal: Positive for constipation. Negative for blood in stool and melena.  Musculoskeletal: Negative.   Neurological: Positive for weakness.    As per HPI. Otherwise, a complete review of systems is negatve.  PAST MEDICAL HISTORY: Past Medical History  Diagnosis Date  . Diabetes mellitus without complication ([place])   . Hypertension   . High cholesterol   . Urinary frequency   . UTI (lower urinary tract infection)   . Dysuria   . Incontinence   . Urinary urgency   . Proteinuria     PAST SURGICAL HISTORY: Past Surgical History  Procedure Laterality Date  . Partial hysterectomy    . Cholecystectomy    . Medial partial knee replacement Right     FAMILY HISTORY Family History  Problem Relation Age of Onset  . Cancer Grandchild     ewing sarcoma  . Kidney disease Neg Hx   . Bladder Cancer Maternal Uncle   . Prostate cancer Maternal Uncle        ADVANCED DIRECTIVES:     HEALTH MAINTENANCE: Social History  Substance Use Topics  . Smoking status: Former Research scientist (life sciences)  . Smokeless tobacco: Not on file     Comment: quit 15 years  . Alcohol Use: No     Colonoscopy:  PAP:  Bone density:  Lipid panel:  Allergies  Allergen Reactions  . Atorvastatin Other (See Comments)    Elevated lft's  . Hydrochlorothiazide Other (See Comments)    Hyponatremia  . Sulfa Antibiotics Other (See Comments)  . Amlodipine Swelling    Current Outpatient Prescriptions  Medication Sig Dispense Refill  . acetaminophen (TYLENOL) 500 MG tablet Take 500 mg by mouth every 6 (six) hours as needed.    Marland Kitchen albuterol (PROAIR HFA) 108 (90 BASE) MCG/ACT inhaler Inhale into the lungs.    . AMITIZA 24 MCG capsule 24 mcg 2 (two) times daily with a meal.     . aspirin 81 MG tablet Take 81 mg by mouth daily.    Marland Kitchen azelastine (ASTELIN) 0.1 % nasal spray Place 2 sprays into both nostrils 2 (two) times daily. Use in each nostril as directed    . cholecalciferol (VITAMIN D) 400 UNITS TABS tablet Take 400 Units by mouth daily.    . diphenhydrAMINE (BENADRYL) 25 MG tablet Take 25 mg by mouth at bedtime as needed.     . diphenhydrAMINE (SOMINEX) 25 MG tablet Take 25 mg by mouth at bedtime as needed for sleep.    Marland Kitchen esomeprazole (NEXIUM) 40 MG capsule Take 40 mg by mouth daily at  12 noon.    . ferrous sulfate 325 (65 FE) MG tablet Take 325 mg by mouth daily with breakfast.    . fexofenadine (ALLEGRA) 180 MG tablet Take 180 mg by mouth daily.    . fluticasone (FLONASE) 50 MCG/ACT nasal spray     . furosemide (LASIX) 20 MG tablet 20 mg daily.     Marland Kitchen gabapentin (NEURONTIN) 800 MG tablet 800 mg at bedtime.     Marland Kitchen glimepiride (AMARYL) 2 MG tablet     . hydrALAZINE (APRESOLINE) 50 MG tablet 50 mg.     . HYDROcodone-acetaminophen (NORCO/VICODIN) 5-325 MG tablet Take by mouth.    . metFORMIN (GLUCOPHAGE) 500 MG tablet 500 mg daily with breakfast.     . niacin (NIASPAN) 500 MG CR tablet 500 mg at bedtime.      . pravastatin (PRAVACHOL) 40 MG tablet 40 mg daily.     Marland Kitchen rOPINIRole (REQUIP) 1 MG tablet 1 mg at bedtime.     . solifenacin (VESICARE) 5 MG tablet Take 1 tablet (5 mg total) by mouth daily. 90 tablet 3  . traZODone (DESYREL) 50 MG tablet     . valsartan (DIOVAN) 320 MG tablet 320 mg daily.     Marland Kitchen venlafaxine XR (EFFEXOR-XR) 75 MG 24 hr capsule 75 mg daily with breakfast.     . vitamin B-12 (CYANOCOBALAMIN) 250 MCG tablet Take 250 mcg by mouth daily.     No current facility-administered medications for this visit.    OBJECTIVE: Filed Vitals:   01/21/15 1045  BP: 173/84  Pulse: 89  Temp: 97.6 F (36.4 C)     Body mass index is 37.13 kg/(m^2).    ECOG FS:0 - Asymptomatic  General: Well-developed, well-nourished, no acute distress. Eyes: anicteric sclera. Lungs: Clear to auscultation bilaterally. Heart: Regular rate and rhythm. No rubs, murmurs, or gallops. Abdomen: Soft, nontender, nondistended. No organomegaly noted, normoactive bowel sounds. Musculoskeletal: No edema, cyanosis, or clubbing. Neuro: Alert, answering all questions appropriately. Cranial nerves grossly intact. Skin: No rashes or petechiae noted. Psych: Normal affect.    LAB RESULTS:  Lab Results  Component Value Date   NA 136 01/27/2013   K 4.0 01/27/2013   CL 100 01/27/2013   CO2 30 01/27/2013   GLUCOSE 150* 01/27/2013   BUN 13 01/27/2013   CREATININE 1.05 01/27/2013   CALCIUM 9.3 01/27/2013   GFRNONAA 55* 01/27/2013   GFRAA >60 01/27/2013    Lab Results  Component Value Date   WBC 9.3 01/19/2015   NEUTROABS 5.8 01/19/2015   HGB 13.3 01/19/2015   HCT 40.5 01/19/2015   MCV 87.4 01/19/2015   PLT 304 01/19/2015     STUDIES: No results found.  ASSESSMENT: Iron-deficiency anemia.  PLAN:    1.  Anemia:  Patient's hemoglobin and iron stores continue to be within normal limits. She last received Feraheme in February 2016. No intervention is needed at this time. Given her constipation, patient  has been instructed to discontinue oral iron. Patient will return to clinic in 3 months with repeat laboratory work, further evaluation, and consideration of additional IV iron.  Previously, colonoscopy, EGD, and capsule endoscopy in 2007 did not reveal an etiology of her iron deficiency anemia.   2. Constipation: Likely secondary to oral iron supplementation. Discontinue as above.   Patient expressed understanding and was in agreement with this plan. She also understands that She can call clinic at any time with any questions, concerns, or complaints.    Lloyd Huger, MD  01/21/2015 2:50 PM

## 2015-04-13 ENCOUNTER — Other Ambulatory Visit: Payer: Self-pay | Admitting: *Deleted

## 2015-04-13 ENCOUNTER — Inpatient Hospital Stay: Payer: Medicare Other | Attending: Oncology

## 2015-04-13 ENCOUNTER — Telehealth: Payer: Self-pay | Admitting: Oncology

## 2015-04-13 DIAGNOSIS — Z7984 Long term (current) use of oral hypoglycemic drugs: Secondary | ICD-10-CM | POA: Insufficient documentation

## 2015-04-13 DIAGNOSIS — D509 Iron deficiency anemia, unspecified: Secondary | ICD-10-CM

## 2015-04-13 DIAGNOSIS — I10 Essential (primary) hypertension: Secondary | ICD-10-CM | POA: Insufficient documentation

## 2015-04-13 DIAGNOSIS — E119 Type 2 diabetes mellitus without complications: Secondary | ICD-10-CM | POA: Insufficient documentation

## 2015-04-13 DIAGNOSIS — Z79899 Other long term (current) drug therapy: Secondary | ICD-10-CM | POA: Insufficient documentation

## 2015-04-13 DIAGNOSIS — Z7982 Long term (current) use of aspirin: Secondary | ICD-10-CM | POA: Diagnosis not present

## 2015-04-13 DIAGNOSIS — E7800 Pure hypercholesterolemia, unspecified: Secondary | ICD-10-CM | POA: Insufficient documentation

## 2015-04-13 DIAGNOSIS — Z87891 Personal history of nicotine dependence: Secondary | ICD-10-CM | POA: Diagnosis not present

## 2015-04-13 DIAGNOSIS — K5900 Constipation, unspecified: Secondary | ICD-10-CM | POA: Diagnosis not present

## 2015-04-13 LAB — IRON AND TIBC
Iron: 30 ug/dL (ref 28–170)
Saturation Ratios: 8 % — ABNORMAL LOW (ref 10.4–31.8)
TIBC: 400 ug/dL (ref 250–450)
UIBC: 370 ug/dL

## 2015-04-13 LAB — CBC WITH DIFFERENTIAL/PLATELET
BASOS PCT: 1 %
Basophils Absolute: 0.1 10*3/uL (ref 0–0.1)
EOS ABS: 0.3 10*3/uL (ref 0–0.7)
EOS PCT: 3 %
HEMATOCRIT: 39 % (ref 35.0–47.0)
Hemoglobin: 12.6 g/dL (ref 12.0–16.0)
Lymphocytes Relative: 19 %
Lymphs Abs: 2.3 10*3/uL (ref 1.0–3.6)
MCH: 28.5 pg (ref 26.0–34.0)
MCHC: 32.2 g/dL (ref 32.0–36.0)
MCV: 88.3 fL (ref 80.0–100.0)
MONO ABS: 0.8 10*3/uL (ref 0.2–0.9)
MONOS PCT: 7 %
NEUTROS ABS: 8.3 10*3/uL — AB (ref 1.4–6.5)
Neutrophils Relative %: 70 %
Platelets: 395 10*3/uL (ref 150–440)
RBC: 4.41 MIL/uL (ref 3.80–5.20)
RDW: 15.2 % — ABNORMAL HIGH (ref 11.5–14.5)
WBC: 11.8 10*3/uL — ABNORMAL HIGH (ref 3.6–11.0)

## 2015-04-13 LAB — FERRITIN: Ferritin: 18 ng/mL (ref 11–307)

## 2015-04-13 NOTE — Telephone Encounter (Signed)
Patient agreed to move md/feraheme appt to earlier time to accomodate our afternoon schedule on Friday, 04/15/15. However, she has asked that if you get her labs back in time and she doesn't need the feraheme to please let her know because in that case she wants to cancel appt. Thanks.

## 2015-04-14 NOTE — Telephone Encounter (Signed)
Iron stores are low. She could use some Feraheme on Friday-per MD.    Patient aware and will come to appt on Friday.

## 2015-04-15 ENCOUNTER — Ambulatory Visit: Payer: Medicare PPO

## 2015-04-15 ENCOUNTER — Inpatient Hospital Stay: Payer: Medicare Other

## 2015-04-15 ENCOUNTER — Inpatient Hospital Stay (HOSPITAL_BASED_OUTPATIENT_CLINIC_OR_DEPARTMENT_OTHER): Payer: Medicare Other | Admitting: Oncology

## 2015-04-15 ENCOUNTER — Ambulatory Visit: Payer: Medicare PPO | Admitting: Oncology

## 2015-04-15 VITALS — BP 148/75 | HR 90 | Temp 96.9°F | Resp 18 | Wt 206.6 lb

## 2015-04-15 VITALS — BP 132/66 | HR 76 | Resp 20

## 2015-04-15 DIAGNOSIS — D509 Iron deficiency anemia, unspecified: Secondary | ICD-10-CM

## 2015-04-15 DIAGNOSIS — Z79899 Other long term (current) drug therapy: Secondary | ICD-10-CM

## 2015-04-15 DIAGNOSIS — K5900 Constipation, unspecified: Secondary | ICD-10-CM | POA: Diagnosis not present

## 2015-04-15 MED ORDER — ALTEPLASE 2 MG IJ SOLR
2.0000 mg | Freq: Once | INTRAMUSCULAR | Status: DC | PRN
  Filled 2015-04-15: qty 2

## 2015-04-15 MED ORDER — SODIUM CHLORIDE 0.9 % IJ SOLN
3.0000 mL | Freq: Once | INTRAMUSCULAR | Status: DC | PRN
  Filled 2015-04-15: qty 10

## 2015-04-15 MED ORDER — SODIUM CHLORIDE 0.9 % IJ SOLN
10.0000 mL | INTRAMUSCULAR | Status: DC | PRN
  Filled 2015-04-15: qty 10

## 2015-04-15 MED ORDER — SODIUM CHLORIDE 0.9 % IV SOLN
Freq: Once | INTRAVENOUS | Status: AC
  Administered 2015-04-15: 09:00:00 via INTRAVENOUS
  Filled 2015-04-15: qty 1000

## 2015-04-15 MED ORDER — SODIUM CHLORIDE 0.9 % IV SOLN
510.0000 mg | Freq: Once | INTRAVENOUS | Status: AC
  Administered 2015-04-15: 510 mg via INTRAVENOUS
  Filled 2015-04-15: qty 17

## 2015-04-15 MED ORDER — HEPARIN SOD (PORK) LOCK FLUSH 100 UNIT/ML IV SOLN: 250.0000 [IU] | Freq: Once | INTRAVENOUS | Status: DC | PRN

## 2015-04-15 MED ORDER — HEPARIN SOD (PORK) LOCK FLUSH 100 UNIT/ML IV SOLN: 500.0000 [IU] | Freq: Once | INTRAVENOUS | Status: DC | PRN

## 2015-04-15 NOTE — Progress Notes (Signed)
Patient has noticed a recent increase in fatigue.

## 2015-04-19 ENCOUNTER — Other Ambulatory Visit: Payer: Self-pay | Admitting: Internal Medicine

## 2015-04-19 ENCOUNTER — Inpatient Hospital Stay: Payer: Medicare Other

## 2015-04-19 ENCOUNTER — Other Ambulatory Visit: Payer: Medicare PPO

## 2015-04-19 VITALS — BP 130/80 | HR 85 | Temp 97.2°F | Resp 20

## 2015-04-19 DIAGNOSIS — D509 Iron deficiency anemia, unspecified: Secondary | ICD-10-CM | POA: Diagnosis not present

## 2015-04-19 DIAGNOSIS — Z1231 Encounter for screening mammogram for malignant neoplasm of breast: Secondary | ICD-10-CM

## 2015-04-19 MED ORDER — SODIUM CHLORIDE 0.9 % IV SOLN
Freq: Once | INTRAVENOUS | Status: AC
  Administered 2015-04-19: 11:00:00 via INTRAVENOUS
  Filled 2015-04-19: qty 1000

## 2015-04-19 MED ORDER — SODIUM CHLORIDE 0.9 % IV SOLN
510.0000 mg | Freq: Once | INTRAVENOUS | Status: AC
  Administered 2015-04-19: 510 mg via INTRAVENOUS
  Filled 2015-04-19: qty 17

## 2015-04-24 NOTE — Progress Notes (Signed)
[place]  Telephone:(336) [phone] Fax:(336) [phone]  ID: Jenna Olson OB: 18-Oct-1945  MR#: [id number]  [id number]  Patient Care Team: Ezequiel Kayser, MD as PCP - General (Internal Medicine)  CHIEF COMPLAINT:  Chief Complaint  Patient presents with  . Anemia   INTERVAL HISTORY: Patient returns to clinic today for repeat laboratory work and further evaluation. She continues to complain of persistent weakness and fatigue. She also has constipation and black tarry stools related to her oral iron supplementation. She otherwise feels well. She denies any shortness of breath, dyspnea on exertion, chest pain, or cough. She denies any melena or hematochezia. She has had no fever, chills, night sweats, or weight loss.  She offers no further specific complaints today.  REVIEW OF SYSTEMS:   Review of Systems  Constitutional: Positive for malaise/fatigue. Negative for fever and weight loss.  Respiratory: Negative.   Cardiovascular: Negative.   Gastrointestinal: Positive for constipation. Negative for blood in stool and melena.  Musculoskeletal: Negative.   Neurological: Negative.     As per HPI. Otherwise, a complete review of systems is negatve.  PAST MEDICAL HISTORY: Past Medical History  Diagnosis Date  . Diabetes mellitus without complication ([place])   . Hypertension   . High cholesterol   . Urinary frequency   . UTI (lower urinary tract infection)   . Dysuria   . Incontinence   . Urinary urgency   . Proteinuria     PAST SURGICAL HISTORY: Past Surgical History  Procedure Laterality Date  . Partial hysterectomy    . Cholecystectomy    . Medial partial knee replacement Right     FAMILY HISTORY Family History  Problem Relation Age of Onset  . Cancer Grandchild     ewing sarcoma  . Kidney disease Neg Hx   . Bladder Cancer Maternal Uncle   . Prostate cancer Maternal Uncle        ADVANCED DIRECTIVES:    HEALTH MAINTENANCE: Social History   Substance Use Topics  . Smoking status: Former Research scientist (life sciences)  . Smokeless tobacco: Not on file     Comment: quit 15 years  . Alcohol Use: No     Colonoscopy:  PAP:  Bone density:  Lipid panel:  Allergies  Allergen Reactions  . Atorvastatin Other (See Comments)    Elevated lft's  . Hydrochlorothiazide Other (See Comments)    Hyponatremia  . Sulfa Antibiotics Other (See Comments)  . Amlodipine Swelling    Current Outpatient Prescriptions  Medication Sig Dispense Refill  . acetaminophen (TYLENOL) 500 MG tablet Take 500 mg by mouth every 6 (six) hours as needed.    Marland Kitchen albuterol (PROAIR HFA) 108 (90 BASE) MCG/ACT inhaler Inhale into the lungs.    . AMITIZA 24 MCG capsule 24 mcg 2 (two) times daily with a meal.     . aspirin 81 MG tablet Take 81 mg by mouth daily.    Marland Kitchen azelastine (ASTELIN) 0.1 % nasal spray Place 2 sprays into both nostrils 2 (two) times daily. Use in each nostril as directed    . carvedilol (COREG) 3.125 MG tablet Take by mouth.    . cholecalciferol (VITAMIN D) 400 UNITS TABS tablet Take 400 Units by mouth daily.    . diphenhydrAMINE (BENADRYL) 25 MG tablet Take 25 mg by mouth at bedtime as needed.     . diphenhydrAMINE (SOMINEX) 25 MG tablet Take 25 mg by mouth at bedtime as needed for sleep.    Marland Kitchen esomeprazole (NEXIUM) 40 MG capsule Take  40 mg by mouth daily at 12 noon.    . ferrous sulfate 325 (65 FE) MG tablet Take 325 mg by mouth daily with breakfast.    . fexofenadine (ALLEGRA) 180 MG tablet Take 180 mg by mouth daily.    . fluticasone (FLONASE) 50 MCG/ACT nasal spray     . furosemide (LASIX) 20 MG tablet 20 mg daily.     Marland Kitchen gabapentin (NEURONTIN) 800 MG tablet 800 mg at bedtime.     Marland Kitchen glimepiride (AMARYL) 2 MG tablet     . hydrALAZINE (APRESOLINE) 50 MG tablet 50 mg.     . HYDROcodone-acetaminophen (NORCO/VICODIN) 5-325 MG tablet Take by mouth.    . Liraglutide (VICTOZA) 18 MG/3ML SOPN Inject into the skin.    Marland Kitchen meclizine (ANTIVERT) 25 MG tablet     . metFORMIN  (GLUCOPHAGE) 500 MG tablet 500 mg daily with breakfast.     . niacin (NIASPAN) 500 MG CR tablet 500 mg at bedtime.     . pravastatin (PRAVACHOL) 40 MG tablet 40 mg daily.     Marland Kitchen rOPINIRole (REQUIP) 1 MG tablet 1 mg at bedtime.     . solifenacin (VESICARE) 5 MG tablet Take 1 tablet (5 mg total) by mouth daily. 90 tablet 3  . traZODone (DESYREL) 50 MG tablet     . valsartan (DIOVAN) 320 MG tablet 320 mg daily.     Marland Kitchen venlafaxine XR (EFFEXOR-XR) 75 MG 24 hr capsule 75 mg daily with breakfast.     . vitamin B-12 (CYANOCOBALAMIN) 250 MCG tablet Take 250 mcg by mouth daily.     No current facility-administered medications for this visit.    OBJECTIVE: Filed Vitals:   04/15/15 0914  BP: 148/75  Pulse: 90  Temp: 96.9 F (36.1 C)  Resp: 18     Body mass index is 37.77 kg/(m^2).    ECOG FS:0 - Asymptomatic  General: Well-developed, well-nourished, no acute distress. Eyes: anicteric sclera. Lungs: Clear to auscultation bilaterally. Heart: Regular rate and rhythm. No rubs, murmurs, or gallops. Abdomen: Soft, nontender, nondistended. No organomegaly noted, normoactive bowel sounds. Musculoskeletal: No edema, cyanosis, or clubbing. Neuro: Alert, answering all questions appropriately. Cranial nerves grossly intact. Skin: No rashes or petechiae noted. Psych: Normal affect.    LAB RESULTS:  Lab Results  Component Value Date   NA 136 01/27/2013   K 4.0 01/27/2013   CL 100 01/27/2013   CO2 30 01/27/2013   GLUCOSE 150* 01/27/2013   BUN 13 01/27/2013   CREATININE 1.05 01/27/2013   CALCIUM 9.3 01/27/2013   GFRNONAA 55* 01/27/2013   GFRAA >60 01/27/2013    Lab Results  Component Value Date   WBC 11.8* 04/13/2015   NEUTROABS 8.3* 04/13/2015   HGB 12.6 04/13/2015   HCT 39.0 04/13/2015   MCV 88.3 04/13/2015   PLT 395 04/13/2015   Lab Results  Component Value Date   IRON 30 04/13/2015   TIBC 400 04/13/2015   IRONPCTSAT 8* 04/13/2015    Lab Results  Component Value Date    FERRITIN 18 04/13/2015     STUDIES: No results found.  ASSESSMENT: Iron-deficiency anemia.  PLAN:    1.  Anemia:  Patient's hemoglobin and iron stores have trended down and she is more symptomatic. Proceed with 510 mg IV Feraheme today and return to clinic in 1 week for a second infusion. Patient will then return to clinic in 3 months with repeat laboratory work, further evaluation, and consideration of additional IV iron.  Previously, colonoscopy, EGD,  and capsule endoscopy in 2007 did not reveal an etiology of her iron deficiency anemia.   2. Constipation: Likely secondary to oral iron supplementation. Discontinue as above.   Patient expressed understanding and was in agreement with this plan. She also understands that She can call clinic at any time with any questions, concerns, or complaints.    Lloyd Huger, MD   04/24/2015 11:28 PM

## 2015-05-09 ENCOUNTER — Ambulatory Visit
Admission: RE | Admit: 2015-05-09 | Discharge: 2015-05-09 | Disposition: A | Discharge status: Home / Self Care | Payer: Medicare Other | Source: Ambulatory Visit | Attending: Internal Medicine | Admitting: Internal Medicine

## 2015-05-09 DIAGNOSIS — Z1231 Encounter for screening mammogram for malignant neoplasm of breast: Secondary | ICD-10-CM | POA: Diagnosis present

## 2015-05-09 HISTORY — DX: Malignant (primary) neoplasm, unspecified: C80.1

## 2015-07-15 ENCOUNTER — Telehealth: Payer: Self-pay | Admitting: Oncology

## 2015-07-15 ENCOUNTER — Inpatient Hospital Stay: Payer: Medicare Other | Attending: Oncology

## 2015-07-15 DIAGNOSIS — D509 Iron deficiency anemia, unspecified: Secondary | ICD-10-CM | POA: Insufficient documentation

## 2015-07-15 DIAGNOSIS — Z79899 Other long term (current) drug therapy: Secondary | ICD-10-CM | POA: Diagnosis not present

## 2015-07-15 LAB — CBC WITH DIFFERENTIAL/PLATELET
Basophils Absolute: 0.2 10*3/uL — ABNORMAL HIGH (ref 0–0.1)
Basophils Relative: 2 %
EOS PCT: 3 %
Eosinophils Absolute: 0.3 10*3/uL (ref 0–0.7)
HCT: 40.9 % (ref 35.0–47.0)
Hemoglobin: 13.9 g/dL (ref 12.0–16.0)
LYMPHS PCT: 23 %
Lymphs Abs: 2.2 10*3/uL (ref 1.0–3.6)
MCH: 31.8 pg (ref 26.0–34.0)
MCHC: 34 g/dL (ref 32.0–36.0)
MCV: 93.3 fL (ref 80.0–100.0)
MONO ABS: 0.9 10*3/uL (ref 0.2–0.9)
MONOS PCT: 10 %
Neutro Abs: 5.8 10*3/uL (ref 1.4–6.5)
Neutrophils Relative %: 62 %
PLATELETS: 306 10*3/uL (ref 150–440)
RBC: 4.39 MIL/uL (ref 3.80–5.20)
RDW: 16 % — AB (ref 11.5–14.5)
WBC: 9.4 10*3/uL (ref 3.6–11.0)

## 2015-07-15 LAB — IRON AND TIBC
Iron: 99 ug/dL (ref 28–170)
Saturation Ratios: 28 % (ref 10.4–31.8)
TIBC: 360 ug/dL (ref 250–450)
UIBC: 261 ug/dL

## 2015-07-15 LAB — FERRITIN: Ferritin: 173 ng/mL (ref 11–307)

## 2015-07-15 NOTE — Telephone Encounter (Signed)
Patient would like you to call her regarding labs. If her labs look good she wants to cx upcoming appt. Please let her know. Thanks!

## 2015-07-15 NOTE — Telephone Encounter (Signed)
Appt cancelled for 7/14. Pt rescheduled to come back in 3 months for repeat labwork. Pt aware.

## 2015-07-22 ENCOUNTER — Ambulatory Visit: Payer: Medicare Other

## 2015-07-22 ENCOUNTER — Ambulatory Visit: Payer: Medicare Other | Admitting: Oncology

## 2015-08-03 ENCOUNTER — Telehealth: Payer: Self-pay

## 2015-08-03 DIAGNOSIS — N390 Urinary tract infection, site not specified: Secondary | ICD-10-CM

## 2015-08-03 NOTE — Telephone Encounter (Signed)
Pt was given a rx by Dr. Elnoria Howard to take macrobid as a  Prophylaxis. Pt was last seen in 12/2014 and I dont see where you dictated pt should continue medication. Pharmacy is requesting a refill. Please advise.

## 2015-08-03 NOTE — Telephone Encounter (Signed)
Okay to refill? 

## 2015-08-04 MED ORDER — NITROFURANTOIN MONOHYD MACRO 100 MG PO CAPS: 100.0000 mg | ORAL_CAPSULE | Freq: Every day | ORAL | 3 refills | Status: AC

## 2015-08-04 NOTE — Telephone Encounter (Signed)
Medication refilled

## 2015-08-18 ENCOUNTER — Other Ambulatory Visit: Payer: Self-pay | Admitting: Nurse Practitioner

## 2015-10-12 ENCOUNTER — Other Ambulatory Visit: Payer: Self-pay | Admitting: *Deleted

## 2015-10-12 ENCOUNTER — Inpatient Hospital Stay: Payer: Medicare Other | Attending: Oncology

## 2015-10-12 DIAGNOSIS — D509 Iron deficiency anemia, unspecified: Secondary | ICD-10-CM

## 2015-10-12 DIAGNOSIS — Z87891 Personal history of nicotine dependence: Secondary | ICD-10-CM | POA: Insufficient documentation

## 2015-10-12 DIAGNOSIS — Z79899 Other long term (current) drug therapy: Secondary | ICD-10-CM | POA: Diagnosis not present

## 2015-10-12 DIAGNOSIS — I10 Essential (primary) hypertension: Secondary | ICD-10-CM | POA: Diagnosis not present

## 2015-10-12 DIAGNOSIS — E119 Type 2 diabetes mellitus without complications: Secondary | ICD-10-CM | POA: Diagnosis not present

## 2015-10-12 DIAGNOSIS — E7800 Pure hypercholesterolemia, unspecified: Secondary | ICD-10-CM | POA: Diagnosis not present

## 2015-10-12 DIAGNOSIS — K5900 Constipation, unspecified: Secondary | ICD-10-CM | POA: Insufficient documentation

## 2015-10-12 LAB — IRON AND TIBC
IRON: 55 ug/dL (ref 28–170)
Saturation Ratios: 17 % (ref 10.4–31.8)
TIBC: 334 ug/dL (ref 250–450)
UIBC: 279 ug/dL

## 2015-10-12 LAB — CBC WITH DIFFERENTIAL/PLATELET
BASOS ABS: 0.1 10*3/uL (ref 0–0.1)
Basophils Relative: 1 %
Eosinophils Absolute: 0.2 10*3/uL (ref 0–0.7)
Eosinophils Relative: 3 %
HEMATOCRIT: 40.9 % (ref 35.0–47.0)
Hemoglobin: 13.7 g/dL (ref 12.0–16.0)
LYMPHS PCT: 20 %
Lymphs Abs: 1.8 10*3/uL (ref 1.0–3.6)
MCH: 31 pg (ref 26.0–34.0)
MCHC: 33.5 g/dL (ref 32.0–36.0)
MCV: 92.4 fL (ref 80.0–100.0)
MONO ABS: 0.9 10*3/uL (ref 0.2–0.9)
MONOS PCT: 9 %
NEUTROS ABS: 6.1 10*3/uL (ref 1.4–6.5)
Neutrophils Relative %: 67 %
Platelets: 343 10*3/uL (ref 150–440)
RBC: 4.43 MIL/uL (ref 3.80–5.20)
RDW: 13.9 % (ref 11.5–14.5)
WBC: 9.1 10*3/uL (ref 3.6–11.0)

## 2015-10-12 LAB — FERRITIN: FERRITIN: 83 ng/mL (ref 11–307)

## 2015-10-12 NOTE — Progress Notes (Signed)
[place]  Telephone:(336) [phone] Fax:(336) [phone]  ID: Duanne Limerick OB: 1945/05/26  MR#: [id number]  DJM#:[id number]  Patient Care Team: Ezequiel Kayser, MD as PCP - General (Internal Medicine)  CHIEF COMPLAINT: Iron-deficiency anemia, unspecified  INTERVAL HISTORY: Patient returns to clinic today for repeat laboratory work and further evaluation. She continues to complain of persistent weakness and fatigue which is chronic and unchanged. She otherwise feels well. She denies any shortness of breath, dyspnea on exertion, chest pain, or cough. She denies any melena or hematochezia. She has had no fever, chills, night sweats, or weight loss.  She offers no further specific complaints today.  REVIEW OF SYSTEMS:   Review of Systems  Constitutional: Positive for malaise/fatigue. Negative for fever and weight loss.  Respiratory: Negative.  Negative for cough and shortness of breath.   Cardiovascular: Negative.  Negative for chest pain and leg swelling.  Gastrointestinal: Positive for constipation. Negative for blood in stool and melena.  Genitourinary: Negative.   Musculoskeletal: Negative.   Neurological: Positive for weakness.  Psychiatric/Behavioral: Negative.  The patient is not nervous/anxious.     As per HPI. Otherwise, a complete review of systems is negative.  PAST MEDICAL HISTORY: Past Medical History:  Diagnosis Date  . Cancer ([place])    skin  . Diabetes mellitus without complication ([place])   . Dysuria   . High cholesterol   . Hypertension   . Incontinence   . Proteinuria   . Urinary frequency   . Urinary urgency   . UTI (lower urinary tract infection)     PAST SURGICAL HISTORY: Past Surgical History:  Procedure Laterality Date  . BREAST BIOPSY Left    neg  . CHOLECYSTECTOMY    . MEDIAL PARTIAL KNEE REPLACEMENT Right   . PARTIAL HYSTERECTOMY      FAMILY HISTORY Family History  Problem Relation Age of Onset  . Cancer Grandchild    ewing sarcoma  . Kidney disease Neg Hx   . Bladder Cancer Maternal Uncle   . Prostate cancer Maternal Uncle        ADVANCED DIRECTIVES:    HEALTH MAINTENANCE: Social History  Substance Use Topics  . Smoking status: Former Research scientist (life sciences)  . Smokeless tobacco: Not on file     Comment: quit 15 years  . Alcohol use No     Colonoscopy:  PAP:  Bone density:  Lipid panel:  Allergies  Allergen Reactions  . Atorvastatin Other (See Comments)    Elevated lft's  . Hydrochlorothiazide Other (See Comments)    Hyponatremia  . Sulfa Antibiotics Other (See Comments)  . Amlodipine Swelling    Current Outpatient Prescriptions  Medication Sig Dispense Refill  . acetaminophen (TYLENOL) 500 MG tablet Take 500 mg by mouth every 6 (six) hours as needed.    Marland Kitchen albuterol (PROAIR HFA) 108 (90 BASE) MCG/ACT inhaler Inhale into the lungs.    . AMITIZA 24 MCG capsule 24 mcg 2 (two) times daily with a meal.     . aspirin 81 MG tablet Take 81 mg by mouth daily.    Marland Kitchen azelastine (ASTELIN) 0.1 % nasal spray Place 2 sprays into both nostrils 2 (two) times daily. Use in each nostril as directed    . carvedilol (COREG) 3.125 MG tablet Take by mouth.    . cholecalciferol (VITAMIN D) 400 UNITS TABS tablet Take 400 Units by mouth daily.    . diphenhydrAMINE (BENADRYL) 25 MG tablet Take 25 mg by mouth at bedtime as needed.     Marland Kitchen  diphenhydrAMINE (SOMINEX) 25 MG tablet Take 25 mg by mouth at bedtime as needed for sleep.    Marland Kitchen esomeprazole (NEXIUM) 40 MG capsule Take 40 mg by mouth daily at 12 noon.    . ferrous sulfate 325 (65 FE) MG tablet Take 325 mg by mouth daily with breakfast.    . fexofenadine (ALLEGRA) 180 MG tablet Take 180 mg by mouth daily.    . fluticasone (FLONASE) 50 MCG/ACT nasal spray     . furosemide (LASIX) 20 MG tablet 20 mg daily.     Marland Kitchen gabapentin (NEURONTIN) 800 MG tablet 800 mg at bedtime.     Marland Kitchen glimepiride (AMARYL) 2 MG tablet     . hydrALAZINE (APRESOLINE) 50 MG tablet 50 mg.     .  HYDROcodone-acetaminophen (NORCO/VICODIN) 5-325 MG tablet Take by mouth.    . Liraglutide (VICTOZA) 18 MG/3ML SOPN Inject into the skin.    Marland Kitchen meclizine (ANTIVERT) 25 MG tablet     . metFORMIN (GLUCOPHAGE) 500 MG tablet 500 mg daily with breakfast.     . niacin (NIASPAN) 500 MG CR tablet 500 mg at bedtime.     . pravastatin (PRAVACHOL) 40 MG tablet 40 mg daily.     Marland Kitchen rOPINIRole (REQUIP) 1 MG tablet 1 mg at bedtime.     . solifenacin (VESICARE) 5 MG tablet Take 1 tablet (5 mg total) by mouth daily. 90 tablet 3  . traZODone (DESYREL) 50 MG tablet     . valsartan (DIOVAN) 320 MG tablet 320 mg daily.     Marland Kitchen venlafaxine XR (EFFEXOR-XR) 75 MG 24 hr capsule 75 mg daily with breakfast.     . vitamin B-12 (CYANOCOBALAMIN) 250 MCG tablet Take 250 mcg by mouth daily.     No current facility-administered medications for this visit.     OBJECTIVE: Vitals:   10/14/15 1356  BP: (!) 153/77  Pulse: 98  Resp: 18  Temp: 97.8 F (36.6 C)     Body mass index is 38.17 kg/m.    ECOG FS:0 - Asymptomatic  General: Well-developed, well-nourished, no acute distress. Eyes: anicteric sclera. Lungs: Clear to auscultation bilaterally. Heart: Regular rate and rhythm. No rubs, murmurs, or gallops. Abdomen: Soft, nontender, nondistended. No organomegaly noted, normoactive bowel sounds. Musculoskeletal: No edema, cyanosis, or clubbing. Neuro: Alert, answering all questions appropriately. Cranial nerves grossly intact. Skin: No rashes or petechiae noted. Psych: Normal affect.    LAB RESULTS:  Lab Results  Component Value Date   NA 136 01/27/2013   K 4.0 01/27/2013   CL 100 01/27/2013   CO2 30 01/27/2013   GLUCOSE 150 (H) 01/27/2013   BUN 13 01/27/2013   CREATININE 1.05 01/27/2013   CALCIUM 9.3 01/27/2013   GFRNONAA 55 (L) 01/27/2013   GFRAA >60 01/27/2013    Lab Results  Component Value Date   WBC 9.1 10/12/2015   NEUTROABS 6.1 10/12/2015   HGB 13.7 10/12/2015   HCT 40.9 10/12/2015   MCV 92.4  10/12/2015   PLT 343 10/12/2015   Lab Results  Component Value Date   IRON 55 10/12/2015   TIBC 334 10/12/2015   IRONPCTSAT 17 10/12/2015    Lab Results  Component Value Date   FERRITIN 83 10/12/2015     STUDIES: No results found.  ASSESSMENT: Iron-deficiency anemia, unspecified.  PLAN:    1.  Iron-deficiency anemia, unspecified:  Patient's hemoglobin and iron stores Are within normal limits today. Patient does not require additional IV iron today. She last received 510 mg IV  Feraheme in April 2017. Return to clinic in 3 months for repeat laboratory work and then in 6 months for repeat laboratory work and further evaluation.  Previously, colonoscopy, EGD, and capsule endoscopy in 2007 did not reveal an etiology of her iron deficiency anemia.   2. Constipation: Likely secondary to oral iron supplementation. Discontinue as above.   Patient expressed understanding and was in agreement with this plan. She also understands that She can call clinic at any time with any questions, concerns, or complaints.    Lloyd Huger, MD   10/19/2015 8:27 AM

## 2015-10-14 ENCOUNTER — Inpatient Hospital Stay: Payer: Medicare Other

## 2015-10-14 ENCOUNTER — Inpatient Hospital Stay (HOSPITAL_BASED_OUTPATIENT_CLINIC_OR_DEPARTMENT_OTHER): Payer: Medicare Other | Admitting: Oncology

## 2015-10-14 VITALS — BP 153/77 | HR 98 | Temp 97.8°F | Resp 18 | Wt 208.7 lb

## 2015-10-14 DIAGNOSIS — Z79899 Other long term (current) drug therapy: Secondary | ICD-10-CM

## 2015-10-14 DIAGNOSIS — D509 Iron deficiency anemia, unspecified: Secondary | ICD-10-CM | POA: Diagnosis not present

## 2015-10-14 DIAGNOSIS — K5900 Constipation, unspecified: Secondary | ICD-10-CM | POA: Diagnosis not present

## 2015-10-14 NOTE — Progress Notes (Signed)
States has decreased energy levels and severe fatigue for the past 2 weeks.

## 2015-10-18 DIAGNOSIS — J449 Chronic obstructive pulmonary disease, unspecified: Secondary | ICD-10-CM | POA: Insufficient documentation

## 2015-11-01 ENCOUNTER — Other Ambulatory Visit: Payer: Self-pay | Admitting: Orthopedic Surgery

## 2015-11-01 DIAGNOSIS — M25311 Other instability, right shoulder: Secondary | ICD-10-CM

## 2015-11-01 DIAGNOSIS — G8929 Other chronic pain: Secondary | ICD-10-CM

## 2015-11-01 DIAGNOSIS — M25511 Pain in right shoulder: Secondary | ICD-10-CM

## 2015-11-22 ENCOUNTER — Encounter (INDEPENDENT_AMBULATORY_CARE_PROVIDER_SITE_OTHER): Payer: Self-pay

## 2015-11-22 ENCOUNTER — Ambulatory Visit
Admission: RE | Admit: 2015-11-22 | Discharge: 2015-11-22 | Disposition: A | Discharge status: Home / Self Care | Payer: Medicare Other | Source: Ambulatory Visit | Attending: Orthopedic Surgery | Admitting: Orthopedic Surgery

## 2015-11-22 DIAGNOSIS — G8929 Other chronic pain: Secondary | ICD-10-CM | POA: Diagnosis not present

## 2015-11-22 DIAGNOSIS — X58XXXA Exposure to other specified factors, initial encounter: Secondary | ICD-10-CM | POA: Diagnosis not present

## 2015-11-22 DIAGNOSIS — M75111 Incomplete rotator cuff tear or rupture of right shoulder, not specified as traumatic: Secondary | ICD-10-CM | POA: Insufficient documentation

## 2015-11-22 DIAGNOSIS — M25511 Pain in right shoulder: Secondary | ICD-10-CM | POA: Diagnosis not present

## 2015-11-22 DIAGNOSIS — M19011 Primary osteoarthritis, right shoulder: Secondary | ICD-10-CM | POA: Diagnosis not present

## 2015-11-22 DIAGNOSIS — S46811A Strain of other muscles, fascia and tendons at shoulder and upper arm level, right arm, initial encounter: Secondary | ICD-10-CM | POA: Diagnosis not present

## 2015-11-22 DIAGNOSIS — M25311 Other instability, right shoulder: Secondary | ICD-10-CM | POA: Diagnosis present

## 2015-11-22 DIAGNOSIS — M7581 Other shoulder lesions, right shoulder: Secondary | ICD-10-CM | POA: Diagnosis not present

## 2015-12-12 ENCOUNTER — Other Ambulatory Visit: Payer: Self-pay | Admitting: Urology

## 2015-12-12 DIAGNOSIS — N390 Urinary tract infection, site not specified: Secondary | ICD-10-CM

## 2015-12-14 ENCOUNTER — Encounter: Payer: Self-pay | Admitting: Urology

## 2015-12-14 ENCOUNTER — Ambulatory Visit (INDEPENDENT_AMBULATORY_CARE_PROVIDER_SITE_OTHER): Payer: Medicare Other | Admitting: Urology

## 2015-12-14 VITALS — BP 130/90 | HR 90 | Ht 62.0 in | Wt 205.3 lb

## 2015-12-14 DIAGNOSIS — Z87898 Personal history of other specified conditions: Secondary | ICD-10-CM

## 2015-12-14 DIAGNOSIS — R350 Frequency of micturition: Secondary | ICD-10-CM | POA: Diagnosis not present

## 2015-12-14 DIAGNOSIS — N393 Stress incontinence (female) (male): Secondary | ICD-10-CM

## 2015-12-14 LAB — BLADDER SCAN AMB NON-IMAGING: Scan Result: 5

## 2015-12-14 MED ORDER — SOLIFENACIN SUCCINATE 5 MG PO TABS: 5.0000 mg | ORAL_TABLET | Freq: Every day | ORAL | 4 refills | Status: AC

## 2015-12-14 MED ORDER — SOLIFENACIN SUCCINATE 10 MG PO TABS: 10.0000 mg | ORAL_TABLET | Freq: Every day | ORAL | 4 refills | Status: DC

## 2015-12-14 NOTE — Progress Notes (Signed)
2:49 PM   [place] 1945-02-05 [id number]  Referring provider: Ezequiel Kayser, MD [place] [place], [place] [address]  Chief Complaint  Patient presents with  . Follow-up    urinary frequency, recurrent UTI    HPI: Patient is a 70 year old Caucasian female who presents today for a one year follow up for a history of recurrent UTI's and urinary frequency.    Urinary frequency Patient has been having success with the Vesicare 5 mg daily.  It does increase her dry mouth, but she does not want to try Myrbetriq due to BP issues.  Her PVR is 5 mL.  She is bothered a very great deal with accidental loss of small amounts of urine and she is somewhat bothered with an uncomfortable urge to urinate.    History of recurrent UTI's Patient was having suprapubic pressure two months ago.  She had an appointment with her PCP, Dr. Raechel Ache, and he sent her urine for culture.  It returned positive for E. Coli and she was treated with an antibiotic.  Her symptoms have abated.  She is not experiencing any dysuria, hematuria or suprapubic pain.  She did not have any associated fevers, chills, nausea or vomiting.  Patient is having some mild SUI.  She is not bothered by this at this time.     PMH: Past Medical History:  Diagnosis Date  . Cancer ([place])    skin  . Diabetes mellitus without complication ([place])   . Dysuria   . High cholesterol   . Hypertension   . Incontinence   . Proteinuria   . Urinary frequency   . Urinary urgency   . UTI (lower urinary tract infection)     Surgical History: Past Surgical History:  Procedure Laterality Date  . BREAST BIOPSY Left    neg  . CHOLECYSTECTOMY    . MEDIAL PARTIAL KNEE REPLACEMENT Right   . PARTIAL HYSTERECTOMY      Home Medications:    Medication List       Accurate as of 12/14/15  2:49 PM. Always use your most recent med list.          acetaminophen 500 MG tablet Commonly known as:  TYLENOL Take  500 mg by mouth every 6 (six) hours as needed.   aspirin EC 81 MG tablet Take by mouth.   azelastine 0.1 % nasal spray Commonly known as:  ASTELIN Place into the nose.   carvedilol 3.125 MG tablet Commonly known as:  COREG Take by mouth.   cetirizine 10 MG tablet Commonly known as:  ZYRTEC Take by mouth.   cholecalciferol 400 units Tabs tablet Commonly known as:  VITAMIN D Take 400 Units by mouth daily.   diphenhydrAMINE 25 MG tablet Commonly known as:  SOMINEX Take 25 mg by mouth at bedtime as needed for sleep.   diphenhydrAMINE 25 MG tablet Commonly known as:  BENADRYL Take 25 mg by mouth at bedtime as needed.   esomeprazole 40 MG capsule Commonly known as:  NEXIUM Take 40 mg by mouth daily at 12 noon.   ferrous sulfate 325 (65 FE) MG tablet Take 325 mg by mouth daily with breakfast.   fexofenadine 180 MG tablet Commonly known as:  ALLEGRA Take 180 mg by mouth daily.   fluticasone 50 MCG/ACT nasal spray Commonly known as:  FLONASE USE 1-2 SPRAYS IN EACH NOSTRIL ONCE DAILY AS NEEDED ALLERGIES   furosemide 20 MG tablet Commonly known as:  LASIX Take by mouth.   gabapentin 800 MG tablet Commonly known as:  NEURONTIN 800 mg nightly.   glimepiride 2 MG tablet Commonly known as:  AMARYL   hydrALAZINE 50 MG tablet Commonly known as:  APRESOLINE Take by mouth.   HYDROcodone-acetaminophen 5-325 MG tablet Commonly known as:  NORCO/VICODIN Take by mouth.   lubiprostone 24 MCG capsule Commonly known as:  AMITIZA Take by mouth.   meclizine 25 MG tablet Commonly known as:  ANTIVERT   metFORMIN 500 MG tablet Commonly known as:  GLUCOPHAGE Take by mouth.   niacin 500 MG CR tablet Commonly known as:  NIASPAN 500 mg at bedtime.   pravastatin 40 MG tablet Commonly known as:  PRAVACHOL Take by mouth.   PROAIR HFA 108 (90 Base) MCG/ACT inhaler Generic drug:  albuterol Inhale into the lungs.   rOPINIRole 1 MG tablet Commonly known as:  REQUIP Take  by mouth.   saxagliptin HCl 5 MG Tabs tablet Commonly known as:  ONGLYZA Take by mouth.   solifenacin 5 MG tablet Commonly known as:  VESICARE Take 1 tablet (5 mg total) by mouth daily.   traMADol 50 MG tablet Commonly known as:  ULTRAM Take by mouth.   traZODone 50 MG tablet Commonly known as:  DESYREL   valsartan 320 MG tablet Commonly known as:  DIOVAN 320 mg daily.   venlafaxine XR 75 MG 24 hr capsule Commonly known as:  EFFEXOR-XR 75 mg daily with breakfast.   VICTOZA 18 MG/3ML Sopn Generic drug:  liraglutide Inject into the skin.   vitamin B-12 250 MCG tablet Commonly known as:  CYANOCOBALAMIN Take 250 mcg by mouth daily.       Allergies:  Allergies  Allergen Reactions  . Atorvastatin Other (See Comments)    Elevated lft's  . Hydrochlorothiazide Other (See Comments)    Hyponatremia  . Sulfa Antibiotics Other (See Comments)  . Amlodipine Swelling    Family History: Family History  Problem Relation Age of Onset  . Cancer Grandchild     ewing sarcoma  . Kidney disease Neg Hx   . Bladder Cancer Maternal Uncle   . Prostate cancer Maternal Uncle     Social History:  reports that she has quit smoking. She does not have any smokeless tobacco history on file. She reports that she does not drink alcohol or use drugs.  ROS: UROLOGY Frequent Urination?: Yes Hard to postpone urination?: No Burning/pain with urination?: No Get up at night to urinate?: No Leakage of urine?: Yes Urine stream starts and stops?: No Trouble starting stream?: No Do you have to strain to urinate?: Yes Blood in urine?: No Urinary tract infection?: No Sexually transmitted disease?: No Injury to kidneys or bladder?: No Painful intercourse?: No Weak stream?: No Currently pregnant?: No Vaginal bleeding?: No Last menstrual period?: n  Gastrointestinal Nausea?: No Vomiting?: No Indigestion/heartburn?: No Diarrhea?: No Constipation?: No  Constitutional Fever: No Night  sweats?: No Weight loss?: No Fatigue?: No  Skin Skin rash/lesions?: No Itching?: No  Eyes Blurred vision?: No Double vision?: No  Ears/Nose/Throat Sore throat?: No Sinus problems?: No  Hematologic/Lymphatic Swollen glands?: No Easy bruising?: No  Cardiovascular Leg swelling?: Yes Chest pain?: No  Respiratory Cough?: No Shortness of breath?: No  Endocrine Excessive thirst?: Yes  Musculoskeletal Back pain?: Yes Joint pain?: No  Neurological Headaches?: No Dizziness?: No  Psychologic Depression?: No Anxiety?: No  Physical Exam: BP 130/90   Pulse 90   Ht 5\' 2"  (1.575 m)   Wt 205  lb 4.8 oz (93.1 kg)   BMI 37.55 kg/m   Constitutional: Well nourished. Alert and oriented, No acute distress. HEENT: [place] AT, moist mucus membranes. Trachea midline, no masses. Cardiovascular: No clubbing, cyanosis, or edema. Respiratory: Normal respiratory effort, no increased work of breathing. GI: Abdomen is soft, non tender, non distended, no abdominal masses. Liver and spleen not palpable.  No hernias appreciated.  Stool sample for occult testing is not indicated.   GU: No CVA tenderness.  No bladder fullness or masses.   Skin: No rashes, bruises or suspicious lesions. Lymph: No cervical or inguinal adenopathy. Neurologic: Grossly intact, no focal deficits, moving all 4 extremities. Psychiatric: Normal mood and affect.  Laboratory Data: Lab Results  Component Value Date   WBC 9.1 10/12/2015   HGB 13.7 10/12/2015   HCT 40.9 10/12/2015   MCV 92.4 10/12/2015   PLT 343 10/12/2015    Lab Results  Component Value Date   CREATININE 1.05 01/27/2013   Pertinent Imaging: Results for KADIAN, BARCELLOS [facility] (MRN [id number]) as of 12/14/2015 14:42  Ref. Range 12/14/2015 14:38  Scan Result Unknown 5    Assessment & Plan:    1. History of recurrent UTI's  - no UTI's since we have last seen her  - patient advised to contact us if she should have symptoms of an UTI    2.  Urinary frequency: Patient is noticing improvement in her urinary symptoms with the Vesicare.  She would like to try to increase the Vesicare 10 mg daily.   Samples are given.  She will RTC in one year for PVR and OAB questionnaire.    - BLADDER SCAN AMB NON-IMAGING  3. SUI  - this is not bothersome to her at this time  - will reassess when she RTC in one year with OAB questionnaire   Return in about 1 year (around 12/13/2016) for PVR and OAB questionnaire.  Zara Council, [facility] [address] [place], [place] [address] [phone]

## 2016-01-11 ENCOUNTER — Other Ambulatory Visit: Payer: Self-pay | Admitting: *Deleted

## 2016-01-11 DIAGNOSIS — D509 Iron deficiency anemia, unspecified: Secondary | ICD-10-CM

## 2016-01-13 ENCOUNTER — Inpatient Hospital Stay: Payer: Medicare Other | Attending: Oncology

## 2016-01-13 DIAGNOSIS — Z79899 Other long term (current) drug therapy: Secondary | ICD-10-CM | POA: Insufficient documentation

## 2016-01-13 DIAGNOSIS — D509 Iron deficiency anemia, unspecified: Secondary | ICD-10-CM | POA: Diagnosis present

## 2016-01-13 LAB — CBC WITH DIFFERENTIAL/PLATELET
BASOS ABS: 0.1 10*3/uL (ref 0–0.1)
Basophils Relative: 1 %
EOS PCT: 2 %
Eosinophils Absolute: 0.2 10*3/uL (ref 0–0.7)
HCT: 40.2 % (ref 35.0–47.0)
HEMOGLOBIN: 13 g/dL (ref 12.0–16.0)
LYMPHS ABS: 1.7 10*3/uL (ref 1.0–3.6)
Lymphocytes Relative: 19 %
MCH: 28.9 pg (ref 26.0–34.0)
MCHC: 32.4 g/dL (ref 32.0–36.0)
MCV: 89.1 fL (ref 80.0–100.0)
Monocytes Absolute: 0.7 10*3/uL (ref 0.2–0.9)
Monocytes Relative: 8 %
NEUTROS PCT: 70 %
Neutro Abs: 6.1 10*3/uL (ref 1.4–6.5)
PLATELETS: 348 10*3/uL (ref 150–440)
RBC: 4.51 MIL/uL (ref 3.80–5.20)
RDW: 15 % — ABNORMAL HIGH (ref 11.5–14.5)
WBC: 8.7 10*3/uL (ref 3.6–11.0)

## 2016-01-13 LAB — IRON AND TIBC
Iron: 36 ug/dL (ref 28–170)
Saturation Ratios: 10 % — ABNORMAL LOW (ref 10.4–31.8)
TIBC: 351 ug/dL (ref 250–450)
UIBC: 315 ug/dL

## 2016-01-13 LAB — FERRITIN: FERRITIN: 27 ng/mL (ref 11–307)

## 2016-03-13 DIAGNOSIS — I739 Peripheral vascular disease, unspecified: Secondary | ICD-10-CM | POA: Insufficient documentation

## 2016-03-13 DIAGNOSIS — I779 Disorder of arteries and arterioles, unspecified: Secondary | ICD-10-CM | POA: Insufficient documentation

## 2016-04-10 ENCOUNTER — Other Ambulatory Visit: Payer: Self-pay | Admitting: *Deleted

## 2016-04-10 DIAGNOSIS — D509 Iron deficiency anemia, unspecified: Secondary | ICD-10-CM

## 2016-04-11 ENCOUNTER — Inpatient Hospital Stay: Payer: Medicare Other | Attending: Oncology

## 2016-04-11 DIAGNOSIS — Z87891 Personal history of nicotine dependence: Secondary | ICD-10-CM | POA: Insufficient documentation

## 2016-04-11 DIAGNOSIS — E119 Type 2 diabetes mellitus without complications: Secondary | ICD-10-CM | POA: Insufficient documentation

## 2016-04-11 DIAGNOSIS — Z7982 Long term (current) use of aspirin: Secondary | ICD-10-CM | POA: Diagnosis not present

## 2016-04-11 DIAGNOSIS — E7800 Pure hypercholesterolemia, unspecified: Secondary | ICD-10-CM | POA: Insufficient documentation

## 2016-04-11 DIAGNOSIS — I10 Essential (primary) hypertension: Secondary | ICD-10-CM | POA: Diagnosis not present

## 2016-04-11 DIAGNOSIS — Z79899 Other long term (current) drug therapy: Secondary | ICD-10-CM | POA: Diagnosis not present

## 2016-04-11 DIAGNOSIS — D509 Iron deficiency anemia, unspecified: Secondary | ICD-10-CM | POA: Insufficient documentation

## 2016-04-11 DIAGNOSIS — Z7984 Long term (current) use of oral hypoglycemic drugs: Secondary | ICD-10-CM | POA: Diagnosis not present

## 2016-04-11 LAB — CBC WITH DIFFERENTIAL/PLATELET
Basophils Absolute: 0.1 10*3/uL (ref 0–0.1)
Basophils Relative: 1 %
Eosinophils Absolute: 0.6 10*3/uL (ref 0–0.7)
Eosinophils Relative: 6 %
HEMATOCRIT: 38.9 % (ref 35.0–47.0)
HEMOGLOBIN: 12.8 g/dL (ref 12.0–16.0)
LYMPHS ABS: 1.9 10*3/uL (ref 1.0–3.6)
Lymphocytes Relative: 19 %
MCH: 27.7 pg (ref 26.0–34.0)
MCHC: 32.8 g/dL (ref 32.0–36.0)
MCV: 84.2 fL (ref 80.0–100.0)
MONOS PCT: 9 %
Monocytes Absolute: 0.9 10*3/uL (ref 0.2–0.9)
NEUTROS ABS: 6.6 10*3/uL — AB (ref 1.4–6.5)
NEUTROS PCT: 65 %
Platelets: 371 10*3/uL (ref 150–440)
RBC: 4.62 MIL/uL (ref 3.80–5.20)
RDW: 16.7 % — ABNORMAL HIGH (ref 11.5–14.5)
WBC: 10.2 10*3/uL (ref 3.6–11.0)

## 2016-04-11 LAB — IRON AND TIBC
Iron: 45 ug/dL (ref 28–170)
Saturation Ratios: 13 % (ref 10.4–31.8)
TIBC: 337 ug/dL (ref 250–450)
UIBC: 292 ug/dL

## 2016-04-11 LAB — FERRITIN: Ferritin: 32 ng/mL (ref 11–307)

## 2016-04-13 ENCOUNTER — Inpatient Hospital Stay (HOSPITAL_BASED_OUTPATIENT_CLINIC_OR_DEPARTMENT_OTHER): Payer: Medicare Other | Admitting: Oncology

## 2016-04-13 ENCOUNTER — Inpatient Hospital Stay: Payer: Medicare Other

## 2016-04-13 VITALS — BP 134/77 | HR 71 | Temp 97.0°F | Resp 18 | Wt 202.4 lb

## 2016-04-13 DIAGNOSIS — Z79899 Other long term (current) drug therapy: Secondary | ICD-10-CM | POA: Diagnosis not present

## 2016-04-13 DIAGNOSIS — D509 Iron deficiency anemia, unspecified: Secondary | ICD-10-CM | POA: Diagnosis not present

## 2016-04-13 NOTE — Progress Notes (Signed)
[place]  [phone] [phone]) [phone]  ID: Duanne Limerick OB: July 21, 1945  MR#: [id number]  LOV#:[id number]  Patient Care Team: Ezequiel Kayser, MD as PCP - General (Internal Medicine)  CHIEF COMPLAINT: Iron-deficiency anemia, unspecified  INTERVAL HISTORY: Patient returns to clinic today for repeat laboratory work and further evaluation. She continues to complain of persistent weakness and fatigue which is chronic and unchanged. She otherwise feels well. She has had no fever, chills, night sweats, or weight loss. She denies any shortness of breath, dyspnea on exertion, chest pain, or cough. She denies any nausea, vomiting, constipation, or diarrhea. She has no melena or hematochezia. She has no urinary complaints. She offers no further specific complaints today.  REVIEW OF SYSTEMS:   Review of Systems  Constitutional: Positive for malaise/fatigue. Negative for fever and weight loss.  Respiratory: Negative.  Negative for cough and shortness of breath.   Cardiovascular: Negative.  Negative for chest pain and leg swelling.  Gastrointestinal: Negative for abdominal pain, blood in stool, constipation and melena.  Genitourinary: Negative.   Musculoskeletal: Negative.   Neurological: Positive for weakness.  Psychiatric/Behavioral: Negative.  The patient is not nervous/anxious.     As per HPI. Otherwise, a complete review of systems is negative.  PAST MEDICAL HISTORY: Past Medical History:  Diagnosis Date  . Cancer ([place])    skin  . Diabetes mellitus without complication ([place])   . Dysuria   . High cholesterol   . Hypertension   . Incontinence   . Proteinuria   . Urinary frequency   . Urinary urgency   . UTI (lower urinary tract infection)     PAST SURGICAL HISTORY: Past Surgical History:  Procedure Laterality Date  . BREAST BIOPSY Left    neg  . CHOLECYSTECTOMY    . MEDIAL PARTIAL KNEE REPLACEMENT Right   . PARTIAL HYSTERECTOMY       FAMILY HISTORY Family History  Problem Relation Age of Onset  . Cancer Grandchild     ewing sarcoma  . Kidney disease Neg Hx   . Bladder Cancer Maternal Uncle   . Prostate cancer Maternal Uncle        ADVANCED DIRECTIVES:    HEALTH MAINTENANCE: Social History  Substance Use Topics  . Smoking status: Former Research scientist (life sciences)  . Smokeless tobacco: Not on file     Comment: quit 15 years  . Alcohol use No     Colonoscopy:  PAP:  Bone density:  Lipid panel:  Allergies  Allergen Reactions  . Atorvastatin Other (See Comments)    Elevated lft's  . Hydrochlorothiazide Other (See Comments)    Hyponatremia  . Sulfa Antibiotics Other (See Comments)  . Amlodipine Swelling    Current Outpatient Prescriptions  Medication Sig Dispense Refill  . acetaminophen (TYLENOL) 500 MG tablet Take 500 mg by mouth every 6 (six) hours as needed.    Marland Kitchen albuterol (PROAIR HFA) 108 (90 BASE) MCG/ACT inhaler Inhale into the lungs.    Marland Kitchen aspirin EC 81 MG tablet Take by mouth.    Marland Kitchen azelastine (ASTELIN) 0.1 % nasal spray Place into the nose.    . cetirizine (ZYRTEC) 10 MG tablet Take by mouth.    . cholecalciferol (VITAMIN D) 400 UNITS TABS tablet Take 400 Units by mouth daily.    . diphenhydrAMINE (BENADRYL) 25 MG tablet Take 25 mg by mouth at bedtime as needed.     . diphenhydrAMINE (SOMINEX) 25 MG tablet Take 25 mg by mouth at bedtime as needed for sleep.    Marland Kitchen  esomeprazole (NEXIUM) 40 MG capsule Take 40 mg by mouth daily at 12 noon.    . ferrous sulfate 325 (65 FE) MG tablet Take 325 mg by mouth daily with breakfast.    . fluticasone (FLONASE) 50 MCG/ACT nasal spray USE 1-2 SPRAYS IN EACH NOSTRIL ONCE DAILY AS NEEDED ALLERGIES    . furosemide (LASIX) 20 MG tablet Take by mouth.    . gabapentin (NEURONTIN) 800 MG tablet 800 mg nightly.     Marland Kitchen glimepiride (AMARYL) 2 MG tablet     . hydrALAZINE (APRESOLINE) 50 MG tablet Take by mouth.    Marland Kitchen HYDROcodone-acetaminophen (NORCO/VICODIN) 5-325 MG tablet Take by  mouth.    . Liraglutide (VICTOZA) 18 MG/3ML SOPN Inject into the skin.    Marland Kitchen lubiprostone (AMITIZA) 24 MCG capsule Take by mouth.    . meclizine (ANTIVERT) 25 MG tablet     . metFORMIN (GLUCOPHAGE) 500 MG tablet Take by mouth.    . niacin (NIASPAN) 500 MG CR tablet 500 mg at bedtime.     . pravastatin (PRAVACHOL) 40 MG tablet Take by mouth.    Marland Kitchen rOPINIRole (REQUIP) 1 MG tablet Take by mouth.    . saxagliptin HCl (ONGLYZA) 5 MG TABS tablet Take by mouth.    . solifenacin (VESICARE) 5 MG tablet Take 1 tablet (5 mg total) by mouth daily. 90 tablet 4  . traMADol (ULTRAM) 50 MG tablet Take by mouth.    . traZODone (DESYREL) 50 MG tablet     . valsartan (DIOVAN) 320 MG tablet 320 mg daily.     Marland Kitchen venlafaxine XR (EFFEXOR-XR) 75 MG 24 hr capsule 75 mg daily with breakfast.     . vitamin B-12 (CYANOCOBALAMIN) 250 MCG tablet Take 250 mcg by mouth daily.    . carvedilol (COREG) 3.125 MG tablet Take by mouth.    . fexofenadine (ALLEGRA) 180 MG tablet Take 180 mg by mouth daily.     No current facility-administered medications for this visit.     OBJECTIVE: Vitals:   04/13/16 1139  BP: 134/77  Pulse: 71  Resp: 18  Temp: 97 F (36.1 C)     Body mass index is 37.02 kg/m.    ECOG FS:0 - Asymptomatic  General: Well-developed, well-nourished, no acute distress. Eyes: anicteric sclera. Lungs: Clear to auscultation bilaterally. Heart: Regular rate and rhythm. No rubs, murmurs, or gallops. Abdomen: Soft, nontender, nondistended. No organomegaly noted, normoactive bowel sounds. Musculoskeletal: No edema, cyanosis, or clubbing. Neuro: Alert, answering all questions appropriately. Cranial nerves grossly intact. Skin: No rashes or petechiae noted. Psych: Normal affect.    LAB RESULTS:  Lab Results  Component Value Date   NA 136 01/27/2013   K 4.0 01/27/2013   CL 100 01/27/2013   CO2 30 01/27/2013   GLUCOSE 150 (H) 01/27/2013   BUN 13 01/27/2013   CREATININE 1.05 01/27/2013   CALCIUM 9.3  01/27/2013   GFRNONAA 55 (L) 01/27/2013   GFRAA >60 01/27/2013    Lab Results  Component Value Date   WBC 10.2 04/11/2016   NEUTROABS 6.6 (H) 04/11/2016   HGB 12.8 04/11/2016   HCT 38.9 04/11/2016   MCV 84.2 04/11/2016   PLT 371 04/11/2016   Lab Results  Component Value Date   IRON 45 04/11/2016   TIBC 337 04/11/2016   IRONPCTSAT 13 04/11/2016    Lab Results  Component Value Date   FERRITIN 32 04/11/2016     STUDIES: No results found.  ASSESSMENT: Iron-deficiency anemia, unspecified.  PLAN:  1.  Iron-deficiency anemia, unspecified:  Patient's hemoglobin and iron stores continue to be within normal limits today. Patient does not require additional IV iron today. She last received 510 mg IV Feraheme in April 2017. Return to clinic in 3 months for repeat laboratory work and further evaluation. If patient's laboratory work continues to be within normal limits, this appointment can be canceled and she can follow-up on an as-needed basis. Previously, colonoscopy, EGD, and capsule endoscopy in 2007 did not reveal an etiology of her iron deficiency anemia.   Approximately 20 minutes was spent in discussion of which greater than 50% was consultation.    Patient expressed understanding and was in agreement with this plan. She also understands that She can call clinic at any time with any questions, concerns, or complaints.    Lloyd Huger, MD   04/13/2016 11:54 AM

## 2016-04-13 NOTE — Progress Notes (Signed)
Patient is here for follow up, she does feel fatigue.

## 2016-04-18 ENCOUNTER — Other Ambulatory Visit: Payer: Self-pay | Admitting: Internal Medicine

## 2016-04-18 DIAGNOSIS — Z1231 Encounter for screening mammogram for malignant neoplasm of breast: Secondary | ICD-10-CM

## 2016-05-09 ENCOUNTER — Ambulatory Visit
Admission: RE | Admit: 2016-05-09 | Discharge: 2016-05-09 | Disposition: A | Discharge status: Home / Self Care | Payer: Medicare Other | Source: Ambulatory Visit | Attending: Internal Medicine | Admitting: Internal Medicine

## 2016-05-09 DIAGNOSIS — Z1231 Encounter for screening mammogram for malignant neoplasm of breast: Secondary | ICD-10-CM | POA: Diagnosis present

## 2016-07-10 ENCOUNTER — Other Ambulatory Visit: Payer: Self-pay

## 2016-07-10 ENCOUNTER — Inpatient Hospital Stay: Payer: Medicare Other | Attending: Oncology

## 2016-07-10 DIAGNOSIS — Z7982 Long term (current) use of aspirin: Secondary | ICD-10-CM | POA: Diagnosis not present

## 2016-07-10 DIAGNOSIS — D509 Iron deficiency anemia, unspecified: Secondary | ICD-10-CM

## 2016-07-10 DIAGNOSIS — Z79899 Other long term (current) drug therapy: Secondary | ICD-10-CM | POA: Diagnosis not present

## 2016-07-10 DIAGNOSIS — Z87891 Personal history of nicotine dependence: Secondary | ICD-10-CM | POA: Diagnosis not present

## 2016-07-10 DIAGNOSIS — Z7984 Long term (current) use of oral hypoglycemic drugs: Secondary | ICD-10-CM | POA: Diagnosis not present

## 2016-07-10 DIAGNOSIS — I10 Essential (primary) hypertension: Secondary | ICD-10-CM | POA: Diagnosis not present

## 2016-07-10 DIAGNOSIS — E7800 Pure hypercholesterolemia, unspecified: Secondary | ICD-10-CM | POA: Insufficient documentation

## 2016-07-10 DIAGNOSIS — E119 Type 2 diabetes mellitus without complications: Secondary | ICD-10-CM | POA: Diagnosis not present

## 2016-07-10 LAB — CBC WITH DIFFERENTIAL/PLATELET
BASOS PCT: 1 %
Basophils Absolute: 0.1 10*3/uL (ref 0–0.1)
EOS ABS: 0.3 10*3/uL (ref 0–0.7)
Eosinophils Relative: 3 %
HEMATOCRIT: 39.4 % (ref 35.0–47.0)
HEMOGLOBIN: 12.9 g/dL (ref 12.0–16.0)
Lymphocytes Relative: 23 %
Lymphs Abs: 2.1 10*3/uL (ref 1.0–3.6)
MCH: 28.2 pg (ref 26.0–34.0)
MCHC: 32.8 g/dL (ref 32.0–36.0)
MCV: 85.9 fL (ref 80.0–100.0)
Monocytes Absolute: 0.8 10*3/uL (ref 0.2–0.9)
Monocytes Relative: 8 %
NEUTROS ABS: 6.2 10*3/uL (ref 1.4–6.5)
NEUTROS PCT: 65 %
Platelets: 307 10*3/uL (ref 150–440)
RBC: 4.59 MIL/uL (ref 3.80–5.20)
RDW: 16.1 % — ABNORMAL HIGH (ref 11.5–14.5)
WBC: 9.5 10*3/uL (ref 3.6–11.0)

## 2016-07-10 LAB — IRON AND TIBC
Iron: 72 ug/dL (ref 28–170)
SATURATION RATIOS: 20 % (ref 10.4–31.8)
TIBC: 353 ug/dL (ref 250–450)
UIBC: 281 ug/dL

## 2016-07-10 LAB — FERRITIN: Ferritin: 23 ng/mL (ref 11–307)

## 2016-07-10 NOTE — Progress Notes (Deleted)
[place]  [phone] [phone]) [phone]  ID: Jenna Olson OB: [birth year]  MR#: [id number]  JIR#:[id number]  Patient Care Team: Ezequiel Kayser, MD as PCP - General (Internal Medicine)  CHIEF COMPLAINT: Iron-deficiency anemia, unspecified  INTERVAL HISTORY: Patient returns to clinic today for repeat laboratory work and further evaluation. She continues to complain of persistent weakness and fatigue which is chronic and unchanged. She otherwise feels well. She has had no fever, chills, night sweats, or weight loss. She denies any shortness of breath, dyspnea on exertion, chest pain, or cough. She denies any nausea, vomiting, constipation, or diarrhea. She has no melena or hematochezia. She has no urinary complaints. She offers no further specific complaints today.  REVIEW OF SYSTEMS:   Review of Systems  Constitutional: Positive for malaise/fatigue. Negative for fever and weight loss.  Respiratory: Negative.  Negative for cough and shortness of breath.   Cardiovascular: Negative.  Negative for chest pain and leg swelling.  Gastrointestinal: Negative for abdominal pain, blood in stool, constipation and melena.  Genitourinary: Negative.   Musculoskeletal: Negative.   Neurological: Positive for weakness.  Psychiatric/Behavioral: Negative.  The patient is not nervous/anxious.     As per HPI. Otherwise, a complete review of systems is negative.  PAST MEDICAL HISTORY: Past Medical History:  Diagnosis Date  . Cancer ([place])    skin  . Diabetes mellitus without complication ([place])   . Dysuria   . High cholesterol   . Hypertension   . Incontinence   . Proteinuria   . Urinary frequency   . Urinary urgency   . UTI (lower urinary tract infection)     PAST SURGICAL HISTORY: Past Surgical History:  Procedure Laterality Date  . ABDOMINAL HYSTERECTOMY    . BREAST BIOPSY Left    neg  . CHOLECYSTECTOMY    . MEDIAL PARTIAL KNEE REPLACEMENT Right   .  PARTIAL HYSTERECTOMY      FAMILY HISTORY Family History  Problem Relation Age of Onset  . Cancer Grandchild        ewing sarcoma  . Bladder Cancer Maternal Uncle   . Prostate cancer Maternal Uncle   . Kidney disease Neg Hx   . Breast cancer Neg Hx        ADVANCED DIRECTIVES:    HEALTH MAINTENANCE: Social History  Substance Use Topics  . Smoking status: Former Research scientist (life sciences)  . Smokeless tobacco: Not on file     Comment: quit 15 years  . Alcohol use No     Colonoscopy:  PAP:  Bone density:  Lipid panel:  Allergies  Allergen Reactions  . Atorvastatin Other (See Comments)    Elevated lft's  . Hydrochlorothiazide Other (See Comments)    Hyponatremia  . Sulfa Antibiotics Other (See Comments)  . Amlodipine Swelling    Current Outpatient Prescriptions  Medication Sig Dispense Refill  . acetaminophen (TYLENOL) 500 MG tablet Take 500 mg by mouth every 6 (six) hours as needed.    Marland Kitchen albuterol (PROAIR HFA) 108 (90 BASE) MCG/ACT inhaler Inhale into the lungs.    Marland Kitchen aspirin EC 81 MG tablet Take by mouth.    Marland Kitchen azelastine (ASTELIN) 0.1 % nasal spray Place into the nose.    . carvedilol (COREG) 3.125 MG tablet Take by mouth.    . cetirizine (ZYRTEC) 10 MG tablet Take by mouth.    . cholecalciferol (VITAMIN D) 400 UNITS TABS tablet Take 400 Units by mouth daily.    . diphenhydrAMINE (BENADRYL) 25 MG tablet Take 25  mg by mouth at bedtime as needed.     . diphenhydrAMINE (SOMINEX) 25 MG tablet Take 25 mg by mouth at bedtime as needed for sleep.    Marland Kitchen esomeprazole (NEXIUM) 40 MG capsule Take 40 mg by mouth daily at 12 noon.    . ferrous sulfate 325 (65 FE) MG tablet Take 325 mg by mouth daily with breakfast.    . fexofenadine (ALLEGRA) 180 MG tablet Take 180 mg by mouth daily.    . fluticasone (FLONASE) 50 MCG/ACT nasal spray USE 1-2 SPRAYS IN EACH NOSTRIL ONCE DAILY AS NEEDED ALLERGIES    . furosemide (LASIX) 20 MG tablet Take by mouth.    . gabapentin (NEURONTIN) 800 MG tablet 800 mg  nightly.     Marland Kitchen glimepiride (AMARYL) 2 MG tablet     . hydrALAZINE (APRESOLINE) 50 MG tablet Take by mouth.    Marland Kitchen HYDROcodone-acetaminophen (NORCO/VICODIN) 5-325 MG tablet Take by mouth.    . Liraglutide (VICTOZA) 18 MG/3ML SOPN Inject into the skin.    Marland Kitchen lubiprostone (AMITIZA) 24 MCG capsule Take by mouth.    . meclizine (ANTIVERT) 25 MG tablet     . metFORMIN (GLUCOPHAGE) 500 MG tablet Take by mouth.    . niacin (NIASPAN) 500 MG CR tablet 500 mg at bedtime.     . pravastatin (PRAVACHOL) 40 MG tablet Take by mouth.    Marland Kitchen rOPINIRole (REQUIP) 1 MG tablet Take by mouth.    . saxagliptin HCl (ONGLYZA) 5 MG TABS tablet Take by mouth.    . solifenacin (VESICARE) 5 MG tablet Take 1 tablet (5 mg total) by mouth daily. 90 tablet 4  . traMADol (ULTRAM) 50 MG tablet Take by mouth.    . traZODone (DESYREL) 50 MG tablet     . valsartan (DIOVAN) 320 MG tablet 320 mg daily.     Marland Kitchen venlafaxine XR (EFFEXOR-XR) 75 MG 24 hr capsule 75 mg daily with breakfast.     . vitamin B-12 (CYANOCOBALAMIN) 250 MCG tablet Take 250 mcg by mouth daily.     No current facility-administered medications for this visit.     OBJECTIVE: There were no vitals filed for this visit.   There is no height or weight on file to calculate BMI.    ECOG FS:0 - Asymptomatic  General: Well-developed, well-nourished, no acute distress. Eyes: anicteric sclera. Lungs: Clear to auscultation bilaterally. Heart: Regular rate and rhythm. No rubs, murmurs, or gallops. Abdomen: Soft, nontender, nondistended. No organomegaly noted, normoactive bowel sounds. Musculoskeletal: No edema, cyanosis, or clubbing. Neuro: Alert, answering all questions appropriately. Cranial nerves grossly intact. Skin: No rashes or petechiae noted. Psych: Normal affect.    LAB RESULTS:  Lab Results  Component Value Date   NA 136 01/27/2013   K 4.0 01/27/2013   CL 100 01/27/2013   CO2 30 01/27/2013   GLUCOSE 150 (H) 01/27/2013   BUN 13 01/27/2013   CREATININE  1.05 01/27/2013   CALCIUM 9.3 01/27/2013   GFRNONAA 55 (L) 01/27/2013   GFRAA >60 01/27/2013    Lab Results  Component Value Date   WBC 10.2 04/11/2016   NEUTROABS 6.6 (H) 04/11/2016   HGB 12.8 04/11/2016   HCT 38.9 04/11/2016   MCV 84.2 04/11/2016   PLT 371 04/11/2016   Lab Results  Component Value Date   IRON 45 04/11/2016   TIBC 337 04/11/2016   IRONPCTSAT 13 04/11/2016    Lab Results  Component Value Date   FERRITIN 32 04/11/2016     STUDIES:  No results found.  ASSESSMENT: Iron-deficiency anemia, unspecified.  PLAN:    1.  Iron-deficiency anemia, unspecified:  Patient's hemoglobin and iron stores continue to be within normal limits today. Patient does not require additional IV iron today. She last received 510 mg IV Feraheme in April 2017. Return to clinic in 3 months for repeat laboratory work and further evaluation. If patient's laboratory work continues to be within normal limits, this appointment can be canceled and she can follow-up on an as-needed basis. Previously, colonoscopy, EGD, and capsule endoscopy in 2007 did not reveal an etiology of her iron deficiency anemia.   Approximately 20 minutes was spent in discussion of which greater than 50% was consultation.    Patient expressed understanding and was in agreement with this plan. She also understands that She can call clinic at any time with any questions, concerns, or complaints.    Lloyd Huger, MD   07/10/2016 12:38 AM

## 2016-07-13 ENCOUNTER — Inpatient Hospital Stay: Payer: Medicare Other | Admitting: Oncology

## 2016-07-13 ENCOUNTER — Inpatient Hospital Stay: Payer: Medicare Other

## 2016-07-19 ENCOUNTER — Telehealth: Payer: Self-pay | Admitting: *Deleted

## 2016-07-19 NOTE — Telephone Encounter (Signed)
Patient informed that labs are WNL, she does not currently have follow up. Would you like to see her back in 3 months?

## 2016-07-19 NOTE — Telephone Encounter (Signed)
-----   Message from Lloyd Huger, MD sent at 07/19/2016  3:17 PM EDT ----- Regarding: RE: results Contact: [phone] Everything WNL.  ----- Message ----- From: Luretha Murphy, CMA Sent: 07/19/2016   2:54 PM To: Lloyd Huger, MD Subject: Melton Alar: results                                      ----- Message ----- From: Secundino Ginger Sent: 07/18/2016   2:25 PM To: Luretha Murphy, CMA Subject: results                                        Pt said she had labs last week and never got results. Will you call her.

## 2016-07-19 NOTE — Telephone Encounter (Signed)
6 months should be fine

## 2016-07-19 NOTE — Telephone Encounter (Signed)
Please schedule patient for 6 months lab only visit, see MD with feraheme 1-2 days later. Patient is seen in [place].

## 2016-07-20 NOTE — Telephone Encounter (Signed)
Jenna Olson can you please schedule patient in [place]. Thank you!

## 2016-12-13 NOTE — Progress Notes (Deleted)
12:04 PM   Jenna Olson 02-Sep-1945 [id number]  Referring provider: Ezequiel Kayser, MD [facility] [place], [place] [address]  No chief complaint on file.   HPI: Patient is a 71 year old Caucasian female who presents today for a one year follow up for a history of recurrent UTI's and urinary frequency.    Urinary frequency Patient has been having success with the Vesicare 5 mg daily.  It does increase her dry mouth, but she does not want to try Myrbetriq due to BP issues.  Her PVR is 5 mL.  She is bothered a very great deal with accidental loss of small amounts of urine and she is somewhat bothered with an uncomfortable urge to urinate.    History of recurrent UTI's Patient was having suprapubic pressure two months ago.  She had an appointment with her PCP, Dr. Raechel Ache, and he sent her urine for culture.  It returned positive for E. Coli and she was treated with an antibiotic.  Her symptoms have abated.  She is not experiencing any dysuria, hematuria or suprapubic pain.  She did not have any associated fevers, chills, nausea or vomiting.  Patient is having some mild SUI.  She is not bothered by this at this time.     PMH: Past Medical History:  Diagnosis Date  . Cancer ([place])    skin  . Diabetes mellitus without complication ([place])   . Dysuria   . High cholesterol   . Hypertension   . Incontinence   . Proteinuria   . Urinary frequency   . Urinary urgency   . UTI (lower urinary tract infection)     Surgical History: Past Surgical History:  Procedure Laterality Date  . ABDOMINAL HYSTERECTOMY    . BREAST BIOPSY Left    neg  . CHOLECYSTECTOMY    . MEDIAL PARTIAL KNEE REPLACEMENT Right   . PARTIAL HYSTERECTOMY      Home Medications:  Allergies as of 12/14/2016      Reactions   Atorvastatin Other (See Comments)   Elevated lft's   Hydrochlorothiazide Other (See Comments)   Hyponatremia   Sulfa Antibiotics Other (See Comments)   Amlodipine Swelling      Medication List        Accurate as of 12/13/16 12:04 PM. Always use your most recent med list.          acetaminophen 500 MG tablet Commonly known as:  TYLENOL Take 500 mg by mouth every 6 (six) hours as needed.   aspirin EC 81 MG tablet Take by mouth.   azelastine 0.1 % nasal spray Commonly known as:  ASTELIN Place into the nose.   carvedilol 3.125 MG tablet Commonly known as:  COREG Take by mouth.   cetirizine 10 MG tablet Commonly known as:  ZYRTEC Take by mouth.   cholecalciferol 400 units Tabs tablet Commonly known as:  VITAMIN D Take 400 Units by mouth daily.   diphenhydrAMINE 25 MG tablet Commonly known as:  SOMINEX Take 25 mg by mouth at bedtime as needed for sleep.   diphenhydrAMINE 25 MG tablet Commonly known as:  BENADRYL Take 25 mg by mouth at bedtime as needed.   esomeprazole 40 MG capsule Commonly known as:  NEXIUM Take 40 mg by mouth daily at 12 noon.   ferrous sulfate 325 (65 FE) MG tablet Take 325 mg by mouth daily with breakfast.   fexofenadine 180 MG tablet Commonly known as:  ALLEGRA Take 180  mg by mouth daily.   fluticasone 50 MCG/ACT nasal spray Commonly known as:  FLONASE USE 1-2 SPRAYS IN EACH NOSTRIL ONCE DAILY AS NEEDED ALLERGIES   furosemide 20 MG tablet Commonly known as:  LASIX Take by mouth.   gabapentin 800 MG tablet Commonly known as:  NEURONTIN 800 mg nightly.   glimepiride 2 MG tablet Commonly known as:  AMARYL   hydrALAZINE 50 MG tablet Commonly known as:  APRESOLINE Take by mouth.   HYDROcodone-acetaminophen 5-325 MG tablet Commonly known as:  NORCO/VICODIN Take by mouth.   lubiprostone 24 MCG capsule Commonly known as:  AMITIZA Take by mouth.   meclizine 25 MG tablet Commonly known as:  ANTIVERT   metFORMIN 500 MG tablet Commonly known as:  GLUCOPHAGE Take by mouth.   niacin 500 MG CR tablet Commonly known as:  NIASPAN 500 mg at bedtime.   pravastatin 40 MG  tablet Commonly known as:  PRAVACHOL Take by mouth.   PROAIR HFA 108 (90 Base) MCG/ACT inhaler Generic drug:  albuterol Inhale into the lungs.   rOPINIRole 1 MG tablet Commonly known as:  REQUIP Take by mouth.   saxagliptin HCl 5 MG Tabs tablet Commonly known as:  ONGLYZA Take by mouth.   solifenacin 5 MG tablet Commonly known as:  VESICARE Take 1 tablet (5 mg total) by mouth daily.   traMADol 50 MG tablet Commonly known as:  ULTRAM Take by mouth.   traZODone 50 MG tablet Commonly known as:  DESYREL   valsartan 320 MG tablet Commonly known as:  DIOVAN 320 mg daily.   venlafaxine XR 75 MG 24 hr capsule Commonly known as:  EFFEXOR-XR 75 mg daily with breakfast.   VICTOZA 18 MG/3ML Sopn Generic drug:  liraglutide Inject into the skin.   vitamin B-12 250 MCG tablet Commonly known as:  CYANOCOBALAMIN Take 250 mcg by mouth daily.       Allergies:  Allergies  Allergen Reactions  . Atorvastatin Other (See Comments)    Elevated lft's  . Hydrochlorothiazide Other (See Comments)    Hyponatremia  . Sulfa Antibiotics Other (See Comments)  . Amlodipine Swelling    Family History: Family History  Problem Relation Age of Onset  . Cancer Grandchild        ewing sarcoma  . Bladder Cancer Maternal Uncle   . Prostate cancer Maternal Uncle   . Kidney disease Neg Hx   . Breast cancer Neg Hx     Social History:  reports that she has quit smoking. She does not have any smokeless tobacco history on file. She reports that she does not drink alcohol or use drugs.  ROS:                                        Physical Exam: There were no vitals taken for this visit.  Constitutional: Well nourished. Alert and oriented, No acute distress. HEENT: [place] AT, moist mucus membranes. Trachea midline, no masses. Cardiovascular: No clubbing, cyanosis, or edema. Respiratory: Normal respiratory effort, no increased work of breathing. GI: Abdomen is soft,  non tender, non distended, no abdominal masses. Liver and spleen not palpable.  No hernias appreciated.  Stool sample for occult testing is not indicated.   GU: No CVA tenderness.  No bladder fullness or masses.   Skin: No rashes, bruises or suspicious lesions. Lymph: No cervical or inguinal adenopathy. Neurologic: Grossly intact, no focal  deficits, moving all 4 extremities. Psychiatric: Normal mood and affect.  Laboratory Data: Lab Results  Component Value Date   WBC 9.5 07/10/2016   HGB 12.9 07/10/2016   HCT 39.4 07/10/2016   MCV 85.9 07/10/2016   PLT 307 07/10/2016    Lab Results  Component Value Date   CREATININE 1.05 01/27/2013   Pertinent Imaging: Results for Jenna Olson, Jenna Olson [facility] (MRN [id number]) as of 12/14/2015 14:42  Ref. Range 12/14/2015 14:38  Scan Result Unknown 5    Assessment & Plan:    1. History of recurrent UTI's  - no UTI's since we have last seen her  - patient advised to contact us if she should have symptoms of an UTI    2. Urinary frequency: Patient is noticing improvement in her urinary symptoms with the Vesicare.  She would like to try to increase the Vesicare 10 mg daily.   Samples are given.  She will RTC in one year for PVR and OAB questionnaire.    - BLADDER SCAN AMB NON-IMAGING  3. SUI  - this is not bothersome to her at this time  - will reassess when she RTC in one year with OAB questionnaire   No Follow-up on file.  Zara Council, [facility] [address], [place], [place] [address] [phone]

## 2016-12-14 ENCOUNTER — Ambulatory Visit: Payer: Medicare Other | Admitting: Urology

## 2016-12-19 ENCOUNTER — Other Ambulatory Visit: Payer: Self-pay | Admitting: Urology

## 2017-01-09 ENCOUNTER — Other Ambulatory Visit: Payer: Self-pay | Admitting: *Deleted

## 2017-01-09 DIAGNOSIS — D649 Anemia, unspecified: Secondary | ICD-10-CM

## 2017-01-09 NOTE — Progress Notes (Signed)
cbc

## 2017-01-15 ENCOUNTER — Inpatient Hospital Stay: Payer: Medicare Other | Attending: Oncology

## 2017-01-15 DIAGNOSIS — Z87891 Personal history of nicotine dependence: Secondary | ICD-10-CM | POA: Diagnosis not present

## 2017-01-15 DIAGNOSIS — E119 Type 2 diabetes mellitus without complications: Secondary | ICD-10-CM | POA: Insufficient documentation

## 2017-01-15 DIAGNOSIS — E7800 Pure hypercholesterolemia, unspecified: Secondary | ICD-10-CM | POA: Insufficient documentation

## 2017-01-15 DIAGNOSIS — Z7984 Long term (current) use of oral hypoglycemic drugs: Secondary | ICD-10-CM | POA: Diagnosis not present

## 2017-01-15 DIAGNOSIS — D649 Anemia, unspecified: Secondary | ICD-10-CM

## 2017-01-15 DIAGNOSIS — Z79899 Other long term (current) drug therapy: Secondary | ICD-10-CM | POA: Insufficient documentation

## 2017-01-15 DIAGNOSIS — I10 Essential (primary) hypertension: Secondary | ICD-10-CM | POA: Insufficient documentation

## 2017-01-15 DIAGNOSIS — Z7982 Long term (current) use of aspirin: Secondary | ICD-10-CM | POA: Insufficient documentation

## 2017-01-15 DIAGNOSIS — D509 Iron deficiency anemia, unspecified: Secondary | ICD-10-CM | POA: Diagnosis not present

## 2017-01-15 LAB — CBC WITH DIFFERENTIAL/PLATELET
Basophils Absolute: 0.1 10*3/uL (ref 0–0.1)
Basophils Relative: 1 %
EOS ABS: 0.3 10*3/uL (ref 0–0.7)
Eosinophils Relative: 4 %
HEMATOCRIT: 37.8 % (ref 35.0–47.0)
HEMOGLOBIN: 12.5 g/dL (ref 12.0–16.0)
LYMPHS ABS: 1.6 10*3/uL (ref 1.0–3.6)
LYMPHS PCT: 23 %
MCH: 28.2 pg (ref 26.0–34.0)
MCHC: 33 g/dL (ref 32.0–36.0)
MCV: 85.5 fL (ref 80.0–100.0)
MONOS PCT: 8 %
Monocytes Absolute: 0.6 10*3/uL (ref 0.2–0.9)
NEUTROS ABS: 4.3 10*3/uL (ref 1.4–6.5)
NEUTROS PCT: 64 %
Platelets: 346 10*3/uL (ref 150–440)
RBC: 4.42 MIL/uL (ref 3.80–5.20)
RDW: 14.8 % — ABNORMAL HIGH (ref 11.5–14.5)
WBC: 6.8 10*3/uL (ref 3.6–11.0)

## 2017-01-15 LAB — IRON AND TIBC
Iron: 64 ug/dL (ref 28–170)
Saturation Ratios: 18 % (ref 10.4–31.8)
TIBC: 364 ug/dL (ref 250–450)
UIBC: 300 ug/dL

## 2017-01-15 LAB — FERRITIN: Ferritin: 18 ng/mL (ref 11–307)

## 2017-01-16 NOTE — Progress Notes (Signed)
[place]  Telephone:(336) [phone] [phone]) [phone]  ID: Jenna Olson OB: 10-May-1945  MR#: [id number]  ZGY#:[id number]  Patient Care Team: Ezequiel Kayser, MD as PCP - General (Internal Medicine)  CHIEF COMPLAINT: Iron-deficiency anemia, unspecified  INTERVAL HISTORY: Patient returns to clinic today for repeat laboratory work and further evaluation. She currently feels well and is asymptomatic.  She does not complain of weakness or fatigue today.  She denies any fever, chills, night sweats, or weight loss. She denies any shortness of breath, dyspnea on exertion, chest pain, or cough. She denies any nausea, vomiting, constipation, or diarrhea. She has no melena or hematochezia. She has no urinary complaints. She offers no specific complaints today.  REVIEW OF SYSTEMS:   Review of Systems  Constitutional: Negative.  Negative for fever, malaise/fatigue and weight loss.  Respiratory: Negative.  Negative for cough and shortness of breath.   Cardiovascular: Negative.  Negative for chest pain and leg swelling.  Gastrointestinal: Negative for abdominal pain, blood in stool, constipation and melena.  Genitourinary: Negative.  Negative for hematuria.  Musculoskeletal: Negative.   Skin: Negative for rash.  Neurological: Negative.  Negative for weakness.  Psychiatric/Behavioral: Negative.  The patient is not nervous/anxious.     As per HPI. Otherwise, a complete review of systems is negative.  PAST MEDICAL HISTORY: Past Medical History:  Diagnosis Date  . Cancer ([place])    skin  . Diabetes mellitus without complication ([place])   . Dysuria   . High cholesterol   . Hypertension   . Incontinence   . Proteinuria   . Urinary frequency   . Urinary urgency   . UTI (lower urinary tract infection)     PAST SURGICAL HISTORY: Past Surgical History:  Procedure Laterality Date  . ABDOMINAL HYSTERECTOMY    . BREAST BIOPSY Left    neg  . CHOLECYSTECTOMY    . MEDIAL  PARTIAL KNEE REPLACEMENT Right   . PARTIAL HYSTERECTOMY      FAMILY HISTORY Family History  Problem Relation Age of Onset  . Cancer Grandchild        ewing sarcoma  . Bladder Cancer Maternal Uncle   . Prostate cancer Maternal Uncle   . Kidney disease Neg Hx   . Breast cancer Neg Hx        ADVANCED DIRECTIVES:    HEALTH MAINTENANCE: Social History   Tobacco Use  . Smoking status: Former Research scientist (life sciences)  . Tobacco comment: quit 15 years  Substance Use Topics  . Alcohol use: No    Alcohol/week: 0.0 oz  . Drug use: No     Colonoscopy:  PAP:  Bone density:  Lipid panel:  Allergies  Allergen Reactions  . Atorvastatin Other (See Comments)    Elevated lft's  . Hydrochlorothiazide Other (See Comments)    Hyponatremia  . Sulfa Antibiotics Other (See Comments)  . Amlodipine Swelling    Current Outpatient Medications  Medication Sig Dispense Refill  . acetaminophen (TYLENOL) 500 MG tablet Take 500 mg by mouth every 6 (six) hours as needed.    Marland Kitchen aspirin EC 81 MG tablet Take by mouth.    Marland Kitchen azelastine (ASTELIN) 0.1 % nasal spray Place into the nose.    . cetirizine (ZYRTEC) 10 MG tablet Take by mouth.    . cholecalciferol (VITAMIN D) 400 UNITS TABS tablet Take 400 Units by mouth daily.    . diphenhydrAMINE (BENADRYL) 25 MG tablet Take 25 mg by mouth at bedtime as needed.     . diphenhydrAMINE (  SOMINEX) 25 MG tablet Take 25 mg by mouth at bedtime as needed for sleep.    Marland Kitchen esomeprazole (NEXIUM) 40 MG capsule Take 40 mg by mouth daily at 12 noon.    Marland Kitchen estradiol (ESTRACE VAGINAL) 0.1 MG/GM vaginal cream Place vaginally.    . ferrous sulfate 325 (65 FE) MG tablet Take 325 mg by mouth daily with breakfast.    . fexofenadine (ALLEGRA) 180 MG tablet Take 180 mg by mouth daily.    . fluticasone (FLONASE) 50 MCG/ACT nasal spray USE 1-2 SPRAYS IN EACH NOSTRIL ONCE DAILY AS NEEDED ALLERGIES    . furosemide (LASIX) 20 MG tablet Take by mouth.    . gabapentin (NEURONTIN) 800 MG tablet 800 mg  nightly.     Marland Kitchen glimepiride (AMARYL) 2 MG tablet     . hydrALAZINE (APRESOLINE) 50 MG tablet Take by mouth.    Marland Kitchen HYDROcodone-acetaminophen (NORCO/VICODIN) 5-325 MG tablet Take by mouth.    . irbesartan (AVAPRO) 300 MG tablet     . lubiprostone (AMITIZA) 24 MCG capsule Take by mouth.    . meclizine (ANTIVERT) 25 MG tablet     . metFORMIN (GLUCOPHAGE) 500 MG tablet Take by mouth.    . niacin (NIASPAN) 500 MG CR tablet 500 mg at bedtime.     . pravastatin (PRAVACHOL) 40 MG tablet Take by mouth.    . psyllium (METAMUCIL) 0.52 g capsule Take by mouth.    Marland Kitchen rOPINIRole (REQUIP) 1 MG tablet Take by mouth.    . saxagliptin HCl (ONGLYZA) 5 MG TABS tablet Take by mouth.    . solifenacin (VESICARE) 5 MG tablet Take 1 tablet (5 mg total) by mouth daily. 90 tablet 4  . traMADol (ULTRAM) 50 MG tablet Take by mouth.    . traZODone (DESYREL) 50 MG tablet     . valsartan (DIOVAN) 320 MG tablet 320 mg daily.     Marland Kitchen venlafaxine XR (EFFEXOR-XR) 75 MG 24 hr capsule 75 mg daily with breakfast.     . vitamin B-12 (CYANOCOBALAMIN) 250 MCG tablet Take 250 mcg by mouth daily.    Marland Kitchen albuterol (PROAIR HFA) 108 (90 BASE) MCG/ACT inhaler Inhale into the lungs.    . carvedilol (COREG) 3.125 MG tablet Take by mouth.    . Liraglutide (VICTOZA) 18 MG/3ML SOPN Inject into the skin.     No current facility-administered medications for this visit.     OBJECTIVE: Vitals:   01/18/17 1017  BP: (!) 152/85  Pulse: 69  Resp: 18  Temp: (!) 97 F (36.1 C)     Body mass index is 35.58 kg/m.    ECOG FS:0 - Asymptomatic  General: Well-developed, well-nourished, no acute distress. Eyes: anicteric sclera. Lungs: Clear to auscultation bilaterally. Heart: Regular rate and rhythm. No rubs, murmurs, or gallops. Abdomen: Soft, nontender, nondistended. No organomegaly noted, normoactive bowel sounds. Musculoskeletal: No edema, cyanosis, or clubbing. Neuro: Alert, answering all questions appropriately. Cranial nerves grossly  intact. Skin: No rashes or petechiae noted. Psych: Normal affect.    LAB RESULTS:  Lab Results  Component Value Date   NA 136 01/27/2013   K 4.0 01/27/2013   CL 100 01/27/2013   CO2 30 01/27/2013   GLUCOSE 150 (H) 01/27/2013   BUN 13 01/27/2013   CREATININE 1.05 01/27/2013   CALCIUM 9.3 01/27/2013   GFRNONAA 55 (L) 01/27/2013   GFRAA >60 01/27/2013    Lab Results  Component Value Date   WBC 6.8 01/15/2017   NEUTROABS 4.3 01/15/2017  HGB 12.5 01/15/2017   HCT 37.8 01/15/2017   MCV 85.5 01/15/2017   PLT 346 01/15/2017   Lab Results  Component Value Date   IRON 64 01/15/2017   TIBC 364 01/15/2017   IRONPCTSAT 18 01/15/2017    Lab Results  Component Value Date   FERRITIN 18 01/15/2017     STUDIES: No results found.  ASSESSMENT: Iron-deficiency anemia, unspecified.  PLAN:    1.  Iron-deficiency anemia, unspecified:  Patient's hemoglobin and iron stores continue to be within normal limits today. Patient does not require additional IV iron today. She last received 510 mg IV Feraheme in April 2017. Return to clinic in 1 year for repeat laboratory work and further evaluation. Previously, colonoscopy, EGD, and capsule endoscopy in 2007 did not reveal an etiology of her iron deficiency anemia.   Approximately 20 minutes was spent in discussion of which greater than 50% was consultation.    Patient expressed understanding and was in agreement with this plan. She also understands that She can call clinic at any time with any questions, concerns, or complaints.    Lloyd Huger, MD   01/18/2017 10:33 AM

## 2017-01-18 ENCOUNTER — Inpatient Hospital Stay: Payer: Medicare Other

## 2017-01-18 ENCOUNTER — Inpatient Hospital Stay: Payer: Medicare Other | Admitting: Oncology

## 2017-01-18 VITALS — BP 152/85 | HR 69 | Temp 97.0°F | Resp 18 | Wt 194.6 lb

## 2017-01-18 DIAGNOSIS — D509 Iron deficiency anemia, unspecified: Secondary | ICD-10-CM | POA: Diagnosis not present

## 2017-05-06 ENCOUNTER — Other Ambulatory Visit: Payer: Self-pay | Admitting: Internal Medicine

## 2017-05-06 DIAGNOSIS — Z1239 Encounter for other screening for malignant neoplasm of breast: Secondary | ICD-10-CM

## 2017-05-16 ENCOUNTER — Ambulatory Visit
Admission: RE | Admit: 2017-05-16 | Discharge: 2017-05-16 | Disposition: A | Discharge status: Home / Self Care | Payer: Medicare Other | Source: Ambulatory Visit | Attending: Internal Medicine | Admitting: Internal Medicine

## 2017-05-16 DIAGNOSIS — Z1231 Encounter for screening mammogram for malignant neoplasm of breast: Secondary | ICD-10-CM | POA: Diagnosis present

## 2017-05-16 DIAGNOSIS — Z1239 Encounter for other screening for malignant neoplasm of breast: Secondary | ICD-10-CM

## 2017-10-09 IMAGING — MG MM DIGITAL SCREENING BILAT W/ CAD
4 series · 4 of 4 positions shown · non-contrast
Comparison: Previous exam(s).

CLINICAL DATA: Screening.

EXAM:
DIGITAL SCREENING BILATERAL MAMMOGRAM WITH CAD

[L MLO]
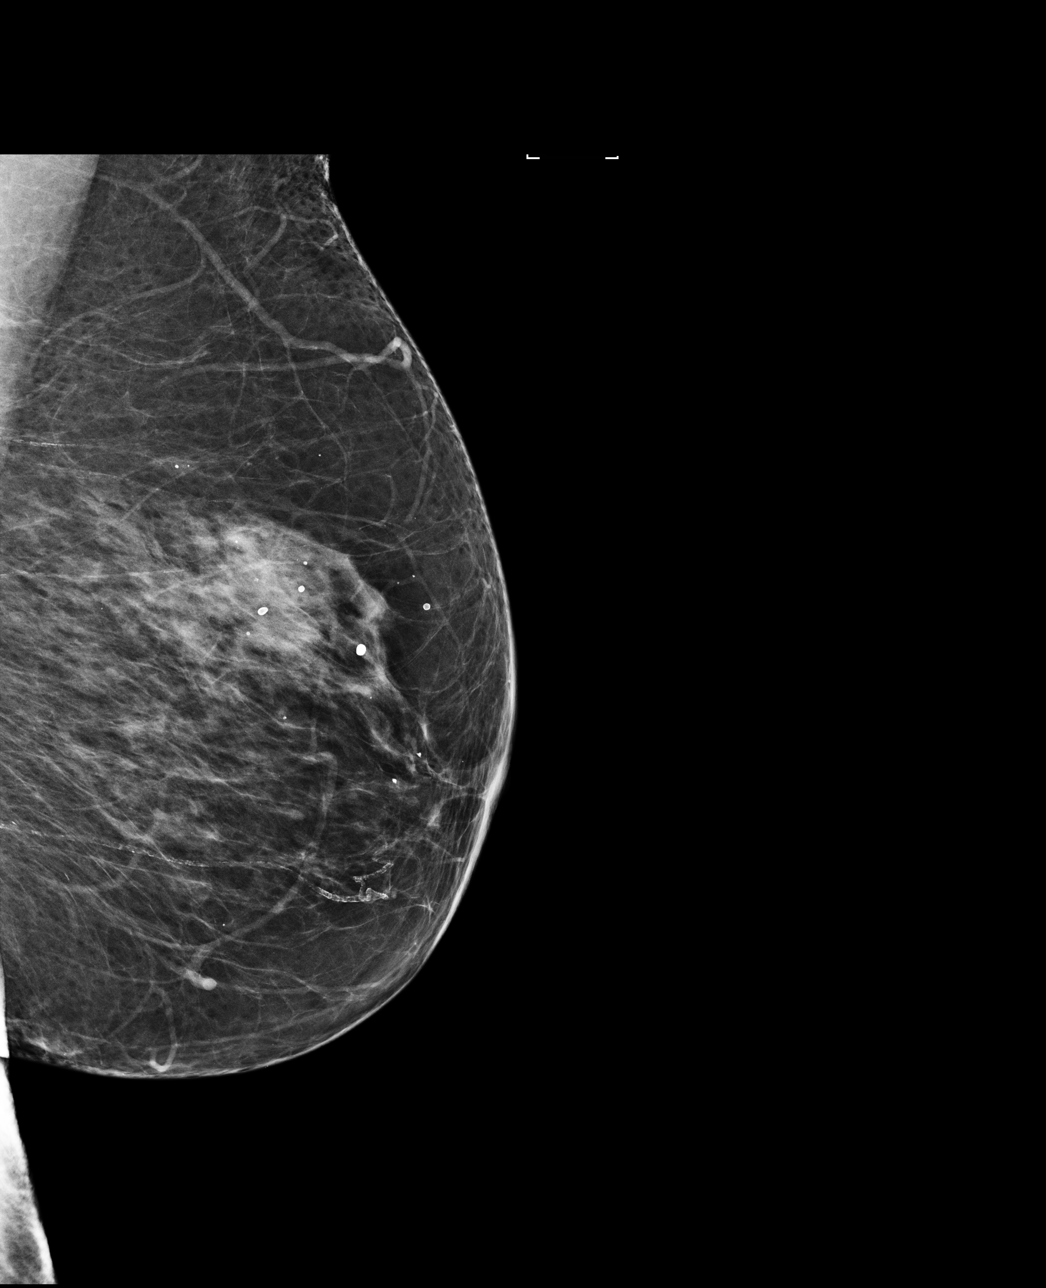

[R CC]
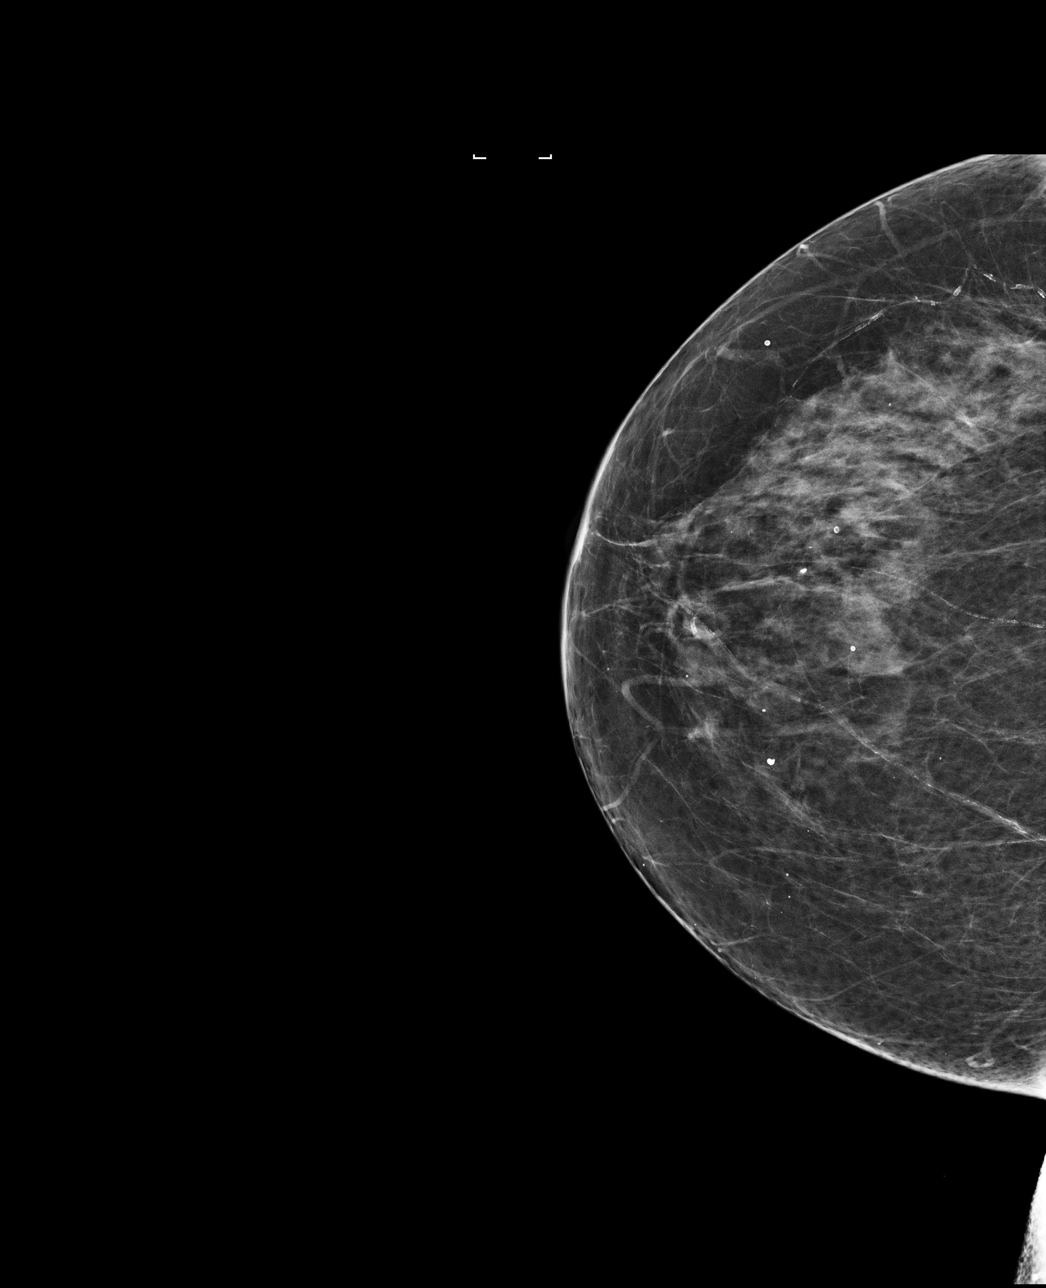

[L CC]
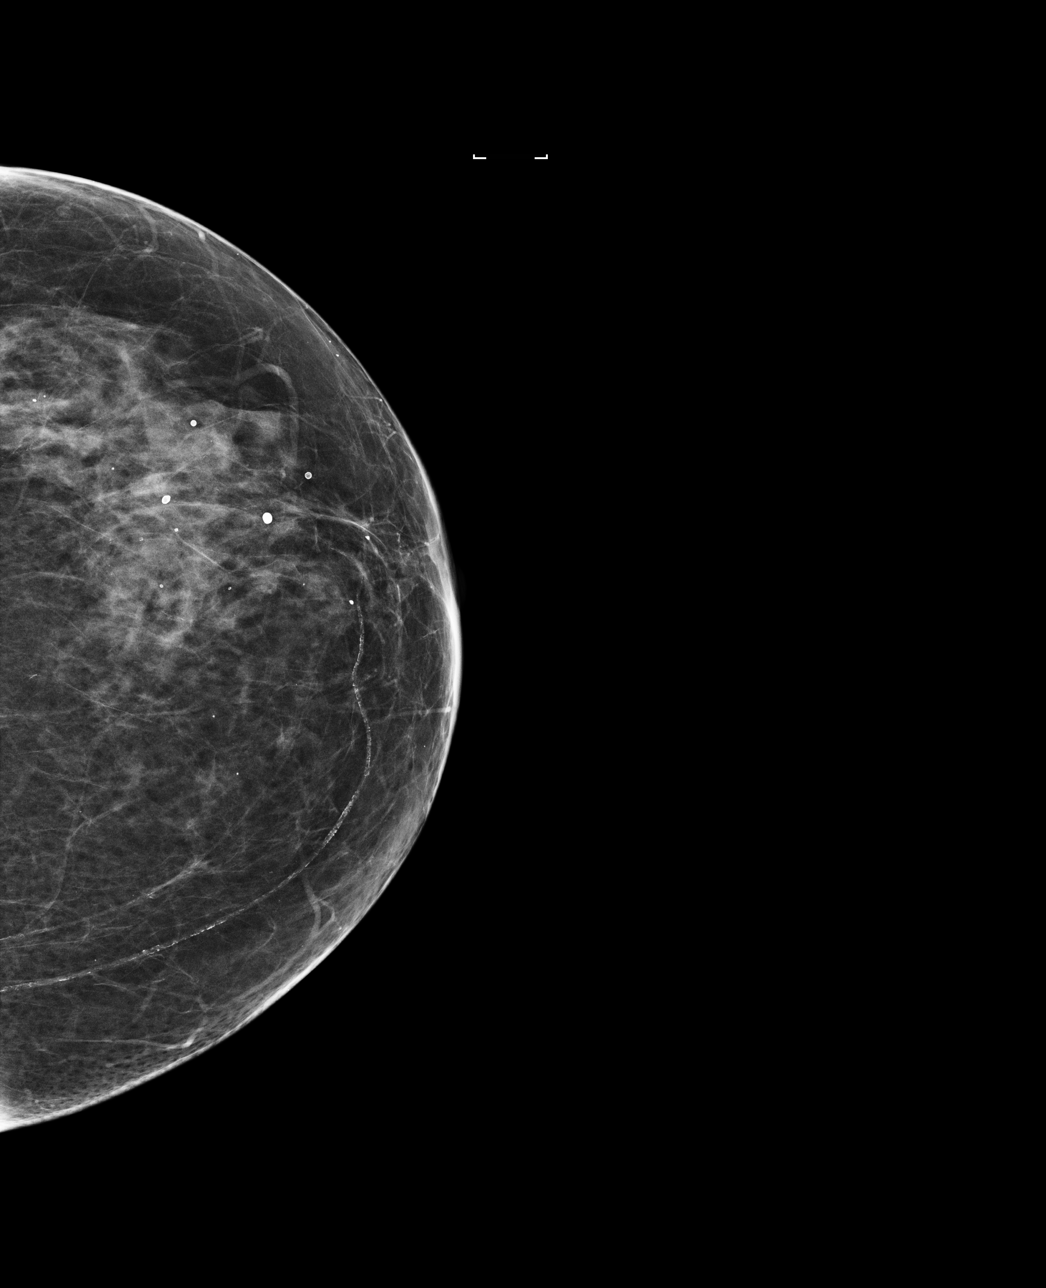

[R MLO]
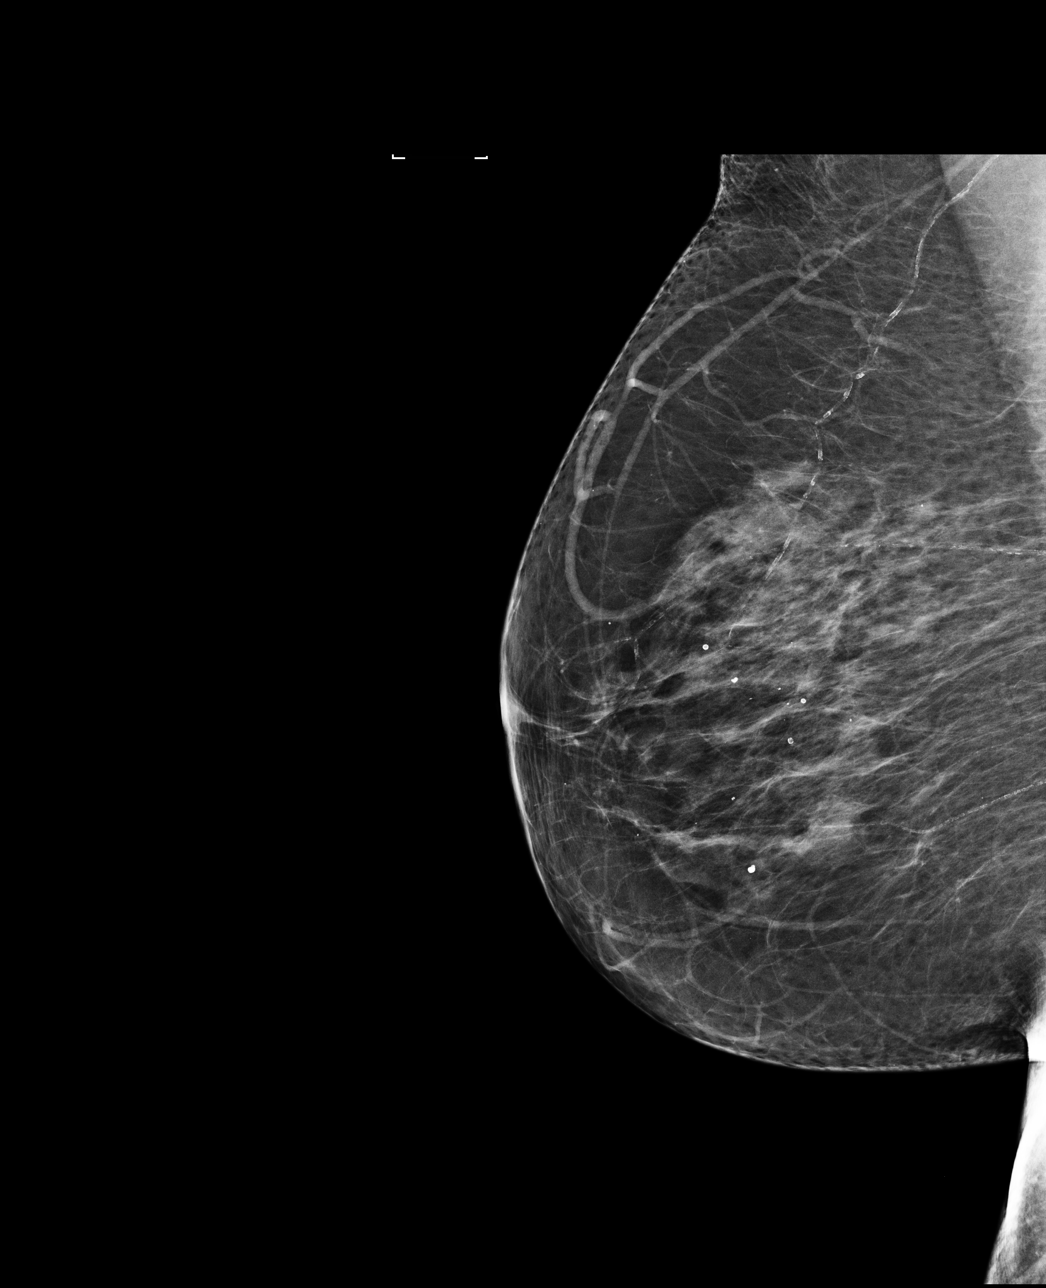

[4 of 4 positions shown; findings below may reference images not displayed]

ACR Breast Density Category b: There are scattered areas of
fibroglandular density.
FINDINGS: There are no findings suspicious for malignancy. Images were
processed with CAD.
IMPRESSION: No mammographic evidence of malignancy. A result letter of this
screening mammogram will be mailed directly to the patient.

RECOMMENDATION:
Screening mammogram in one year. (Code:AS-G-LCT)

BI-RADS CATEGORY  1: Negative.

## 2017-11-05 DIAGNOSIS — N183 Chronic kidney disease, stage 3 unspecified: Secondary | ICD-10-CM | POA: Insufficient documentation

## 2018-01-07 ENCOUNTER — Other Ambulatory Visit: Payer: Self-pay | Admitting: *Deleted

## 2018-01-07 DIAGNOSIS — D649 Anemia, unspecified: Secondary | ICD-10-CM

## 2018-01-10 NOTE — Progress Notes (Signed)
Jenna Olson  Telephone:(336) [phone] Fax:(336) [phone]  ID: Duanne Limerick OB: 1945/05/02  MR#: [id number]  TFT#:[id number]  Patient Care Team: Ezequiel Kayser, MD as PCP - General (Internal Medicine)  CHIEF COMPLAINT: Iron-deficiency anemia, unspecified  INTERVAL HISTORY: Patient returns to clinic today for repeat laboratory work and routine yearly evaluation.  She continues to feel well and remains asymptomatic. She does not complain of weakness or fatigue today.  She denies any fever, chills, night sweats, or weight loss. She denies any shortness of breath, dyspnea on exertion, chest pain, or cough. She denies any nausea, vomiting, constipation, or diarrhea. She has no melena or hematochezia. She has no urinary complaints.  Patient feels at her baseline offers no specific complaints today.  REVIEW OF SYSTEMS:   Review of Systems  Constitutional: Negative.  Negative for fever, malaise/fatigue and weight loss.  Respiratory: Negative.  Negative for cough and shortness of breath.   Cardiovascular: Negative.  Negative for chest pain and leg swelling.  Gastrointestinal: Negative.  Negative for abdominal pain, blood in stool, constipation and melena.  Genitourinary: Negative.  Negative for hematuria.  Musculoskeletal: Negative.  Negative for back pain.  Skin: Negative.  Negative for rash.  Neurological: Negative.  Negative for dizziness, focal weakness, weakness and headaches.  Psychiatric/Behavioral: Negative.  The patient is not nervous/anxious.     As per HPI. Otherwise, a complete review of systems is negative.  PAST MEDICAL HISTORY: Past Medical History:  Diagnosis Date  . Cancer ([place])    skin  . Diabetes mellitus without complication ([place])   . Dysuria   . High cholesterol   . Hypertension   . Incontinence   . Proteinuria   . Urinary frequency   . Urinary urgency   . UTI (lower urinary tract infection)     PAST SURGICAL HISTORY: Past Surgical  History:  Procedure Laterality Date  . ABDOMINAL HYSTERECTOMY    . BREAST BIOPSY Left    neg  . CHOLECYSTECTOMY    . MEDIAL PARTIAL KNEE REPLACEMENT Right   . PARTIAL HYSTERECTOMY      FAMILY HISTORY Family History  Problem Relation Age of Onset  . Cancer Grandchild        ewing sarcoma  . Bladder Cancer Maternal Uncle   . Prostate cancer Maternal Uncle   . Kidney disease Neg Hx   . Breast cancer Neg Hx        ADVANCED DIRECTIVES:    HEALTH MAINTENANCE: Social History   Tobacco Use  . Smoking status: Former Research scientist (life sciences)  . Tobacco comment: quit 15 years  Substance Use Topics  . Alcohol use: No    Alcohol/week: 0.0 standard drinks  . Drug use: No     Colonoscopy:  PAP:  Bone density:  Lipid panel:  Allergies  Allergen Reactions  . Atorvastatin Other (See Comments)    Elevated lft's  . Hydrochlorothiazide Other (See Comments)    Hyponatremia  . Sulfa Antibiotics Other (See Comments)  . Amlodipine Swelling    Current Outpatient Medications  Medication Sig Dispense Refill  . acetaminophen (TYLENOL) 500 MG tablet Take 500 mg by mouth every 6 (six) hours as needed.    Marland Kitchen aspirin EC 81 MG tablet Take by mouth.    Marland Kitchen azelastine (ASTELIN) 0.1 % nasal spray Place into the nose.    . cetirizine (ZYRTEC) 10 MG tablet Take by mouth.    . cholecalciferol (VITAMIN D) 400 UNITS TABS tablet Take 400 Units by mouth daily.    Marland Kitchen  diphenhydrAMINE (BENADRYL) 25 MG tablet Take 25 mg by mouth at bedtime as needed.     . diphenhydrAMINE (SOMINEX) 25 MG tablet Take 25 mg by mouth at bedtime as needed for sleep.    Marland Kitchen esomeprazole (NEXIUM) 40 MG capsule Take 40 mg by mouth daily at 12 noon.    . ferrous sulfate 325 (65 FE) MG tablet Take 325 mg by mouth daily with breakfast.    . fexofenadine (ALLEGRA) 180 MG tablet Take 180 mg by mouth daily.    . fluticasone (FLONASE) 50 MCG/ACT nasal spray USE 1-2 SPRAYS IN EACH NOSTRIL ONCE DAILY AS NEEDED ALLERGIES    . furosemide (LASIX) 20 MG  tablet Take by mouth.    . gabapentin (NEURONTIN) 800 MG tablet 800 mg nightly.     Marland Kitchen glimepiride (AMARYL) 2 MG tablet     . hydrALAZINE (APRESOLINE) 50 MG tablet Take by mouth.    Marland Kitchen HYDROcodone-acetaminophen (NORCO/VICODIN) 5-325 MG tablet Take by mouth.    . irbesartan (AVAPRO) 300 MG tablet     . lubiprostone (AMITIZA) 24 MCG capsule Take by mouth.    . meclizine (ANTIVERT) 25 MG tablet     . metFORMIN (GLUCOPHAGE) 500 MG tablet Take by mouth.    . niacin (NIASPAN) 500 MG CR tablet 500 mg at bedtime.     . pravastatin (PRAVACHOL) 40 MG tablet Take by mouth.    . psyllium (METAMUCIL) 0.52 g capsule Take by mouth.    Marland Kitchen rOPINIRole (REQUIP) 1 MG tablet Take by mouth.    . saxagliptin HCl (ONGLYZA) 5 MG TABS tablet Take by mouth.    . solifenacin (VESICARE) 5 MG tablet Take 1 tablet (5 mg total) by mouth daily. 90 tablet 4  . traMADol (ULTRAM) 50 MG tablet Take by mouth.    . traZODone (DESYREL) 50 MG tablet     . valsartan (DIOVAN) 320 MG tablet 320 mg daily.     Marland Kitchen venlafaxine XR (EFFEXOR-XR) 75 MG 24 hr capsule 75 mg daily with breakfast.     . vitamin B-12 (CYANOCOBALAMIN) 250 MCG tablet Take 250 mcg by mouth daily.    Marland Kitchen albuterol (PROAIR HFA) 108 (90 BASE) MCG/ACT inhaler Inhale into the lungs.    . carvedilol (COREG) 3.125 MG tablet Take by mouth.     No current facility-administered medications for this visit.     OBJECTIVE: Vitals:   01/17/18 1015  BP: 136/82  Pulse: 79  Resp: 18  Temp: (!) 97.1 F (36.2 C)     Body mass index is 34.4 kg/m.    ECOG FS:0 - Asymptomatic  General: Well-developed, well-nourished, no acute distress. Eyes: Pink conjunctiva, anicteric sclera. HEENT: Normocephalic, moist mucous membranes. Lungs: Clear to auscultation bilaterally. Heart: Regular rate and rhythm. No rubs, murmurs, or gallops. Abdomen: Soft, nontender, nondistended. No organomegaly noted, normoactive bowel sounds. Musculoskeletal: No edema, cyanosis, or clubbing. Neuro: Alert,  answering all questions appropriately. Cranial nerves grossly intact. Skin: No rashes or petechiae noted. Psych: Normal affect.  LAB RESULTS:  Lab Results  Component Value Date   NA 136 01/27/2013   K 4.0 01/27/2013   CL 100 01/27/2013   CO2 30 01/27/2013   GLUCOSE 150 (H) 01/27/2013   BUN 13 01/27/2013   CREATININE 1.05 01/27/2013   CALCIUM 9.3 01/27/2013   GFRNONAA 55 (L) 01/27/2013   GFRAA >60 01/27/2013    Lab Results  Component Value Date   WBC 10.4 01/14/2018   NEUTROABS 6.8 01/14/2018   HGB 12.6  01/14/2018   HCT 38.6 01/14/2018   MCV 88.9 01/14/2018   PLT 339 01/14/2018   Lab Results  Component Value Date   IRON 52 01/14/2018   TIBC 358 01/14/2018   IRONPCTSAT 15 01/14/2018    Lab Results  Component Value Date   FERRITIN 21 01/14/2018     STUDIES: No results found.  ASSESSMENT: Iron-deficiency anemia, unspecified.  PLAN:    1.  Iron-deficiency anemia, unspecified:  Patient's hemoglobin and iron stores continue to be within normal limits today. Patient does not require additional IV iron today. She last received 510 mg IV Feraheme in April 2017.  Previously, colonoscopy, EGD, and capsule endoscopy in 2007 did not reveal an etiology of her iron deficiency anemia.  After discussion with the patient, it was agreed upon that no further follow up is necessary. Please refer her back if there are any questions or concerns.  I spent a total of 20 minutes face-to-face with the patient of which greater than 50% of the visit was spent in counseling and coordination of care as detailed above.  Patient expressed understanding and was in agreement with this plan. She also understands that She can call clinic at any time with any questions, concerns, or complaints.    Lloyd Huger, MD   01/17/2018 12:00 PM

## 2018-01-14 ENCOUNTER — Inpatient Hospital Stay: Payer: Medicare Other

## 2018-01-14 ENCOUNTER — Inpatient Hospital Stay: Payer: Medicare Other | Attending: Oncology

## 2018-01-14 DIAGNOSIS — D509 Iron deficiency anemia, unspecified: Secondary | ICD-10-CM | POA: Insufficient documentation

## 2018-01-14 DIAGNOSIS — E119 Type 2 diabetes mellitus without complications: Secondary | ICD-10-CM | POA: Insufficient documentation

## 2018-01-14 DIAGNOSIS — I10 Essential (primary) hypertension: Secondary | ICD-10-CM | POA: Diagnosis not present

## 2018-01-14 DIAGNOSIS — Z87891 Personal history of nicotine dependence: Secondary | ICD-10-CM | POA: Diagnosis not present

## 2018-01-14 DIAGNOSIS — Z79899 Other long term (current) drug therapy: Secondary | ICD-10-CM | POA: Diagnosis not present

## 2018-01-14 DIAGNOSIS — Z7984 Long term (current) use of oral hypoglycemic drugs: Secondary | ICD-10-CM | POA: Diagnosis not present

## 2018-01-14 DIAGNOSIS — Z7982 Long term (current) use of aspirin: Secondary | ICD-10-CM | POA: Diagnosis not present

## 2018-01-14 DIAGNOSIS — D649 Anemia, unspecified: Secondary | ICD-10-CM

## 2018-01-14 LAB — CBC WITH DIFFERENTIAL/PLATELET
Abs Immature Granulocytes: 0.09 10*3/uL — ABNORMAL HIGH (ref 0.00–0.07)
Basophils Absolute: 0.1 10*3/uL (ref 0.0–0.1)
Basophils Relative: 1 %
Eosinophils Absolute: 0.3 10*3/uL (ref 0.0–0.5)
Eosinophils Relative: 3 %
HCT: 38.6 % (ref 36.0–46.0)
Hemoglobin: 12.6 g/dL (ref 12.0–15.0)
Immature Granulocytes: 1 %
Lymphocytes Relative: 24 %
Lymphs Abs: 2.5 10*3/uL (ref 0.7–4.0)
MCH: 29 pg (ref 26.0–34.0)
MCHC: 32.6 g/dL (ref 30.0–36.0)
MCV: 88.9 fL (ref 80.0–100.0)
Monocytes Absolute: 0.7 10*3/uL (ref 0.1–1.0)
Monocytes Relative: 7 %
Neutro Abs: 6.8 10*3/uL (ref 1.7–7.7)
Neutrophils Relative %: 64 %
Platelets: 339 10*3/uL (ref 150–400)
RBC: 4.34 MIL/uL (ref 3.87–5.11)
RDW: 14 % (ref 11.5–15.5)
WBC: 10.4 10*3/uL (ref 4.0–10.5)
nRBC: 0 % (ref 0.0–0.2)

## 2018-01-14 LAB — IRON AND TIBC
Iron: 52 ug/dL (ref 28–170)
SATURATION RATIOS: 15 % (ref 10.4–31.8)
TIBC: 358 ug/dL (ref 250–450)
UIBC: 306 ug/dL

## 2018-01-14 LAB — FERRITIN: Ferritin: 21 ng/mL (ref 11–307)

## 2018-01-15 ENCOUNTER — Other Ambulatory Visit: Payer: Self-pay | Admitting: Oncology

## 2018-01-15 ENCOUNTER — Encounter: Payer: Self-pay | Admitting: Oncology

## 2018-01-17 ENCOUNTER — Inpatient Hospital Stay (HOSPITAL_BASED_OUTPATIENT_CLINIC_OR_DEPARTMENT_OTHER): Payer: Medicare Other | Admitting: Oncology

## 2018-01-17 ENCOUNTER — Inpatient Hospital Stay: Payer: Medicare Other

## 2018-01-17 VITALS — BP 136/82 | HR 79 | Temp 97.1°F | Resp 18 | Wt 188.1 lb

## 2018-01-17 DIAGNOSIS — I10 Essential (primary) hypertension: Secondary | ICD-10-CM | POA: Diagnosis not present

## 2018-01-17 DIAGNOSIS — D509 Iron deficiency anemia, unspecified: Secondary | ICD-10-CM

## 2018-01-17 DIAGNOSIS — Z87891 Personal history of nicotine dependence: Secondary | ICD-10-CM

## 2018-01-17 DIAGNOSIS — Z79899 Other long term (current) drug therapy: Secondary | ICD-10-CM

## 2018-01-17 DIAGNOSIS — Z7982 Long term (current) use of aspirin: Secondary | ICD-10-CM

## 2018-01-17 DIAGNOSIS — E119 Type 2 diabetes mellitus without complications: Secondary | ICD-10-CM

## 2018-01-17 DIAGNOSIS — Z7984 Long term (current) use of oral hypoglycemic drugs: Secondary | ICD-10-CM

## 2018-01-17 NOTE — Progress Notes (Signed)
Pt in for follow up, reports "legs ache all the time".  Denies any other concerns today.

## 2018-02-17 ENCOUNTER — Encounter: Payer: Self-pay | Admitting: *Deleted

## 2018-02-18 ENCOUNTER — Ambulatory Visit: Payer: Medicare Other | Admitting: Anesthesiology

## 2018-02-18 ENCOUNTER — Encounter: Payer: Self-pay | Admitting: *Deleted

## 2018-02-18 ENCOUNTER — Other Ambulatory Visit: Payer: Self-pay

## 2018-02-18 ENCOUNTER — Encounter
Admission: RE | Disposition: A | Discharge status: Home / Self Care | Payer: Self-pay | Source: Home / Self Care | Attending: Gastroenterology

## 2018-02-18 ENCOUNTER — Ambulatory Visit
Admission: RE | Admit: 2018-02-18 | Discharge: 2018-02-18 | Disposition: A | Discharge status: Home / Self Care | Payer: Medicare Other | Attending: Gastroenterology | Admitting: Gastroenterology

## 2018-02-18 DIAGNOSIS — G4730 Sleep apnea, unspecified: Secondary | ICD-10-CM | POA: Insufficient documentation

## 2018-02-18 DIAGNOSIS — Z87891 Personal history of nicotine dependence: Secondary | ICD-10-CM | POA: Diagnosis not present

## 2018-02-18 DIAGNOSIS — Z79899 Other long term (current) drug therapy: Secondary | ICD-10-CM | POA: Diagnosis not present

## 2018-02-18 DIAGNOSIS — K5730 Diverticulosis of large intestine without perforation or abscess without bleeding: Secondary | ICD-10-CM | POA: Insufficient documentation

## 2018-02-18 DIAGNOSIS — K5790 Diverticulosis of intestine, part unspecified, without perforation or abscess without bleeding: Secondary | ICD-10-CM | POA: Insufficient documentation

## 2018-02-18 DIAGNOSIS — Z7951 Long term (current) use of inhaled steroids: Secondary | ICD-10-CM | POA: Diagnosis not present

## 2018-02-18 DIAGNOSIS — N189 Chronic kidney disease, unspecified: Secondary | ICD-10-CM | POA: Insufficient documentation

## 2018-02-18 DIAGNOSIS — E1122 Type 2 diabetes mellitus with diabetic chronic kidney disease: Secondary | ICD-10-CM | POA: Insufficient documentation

## 2018-02-18 DIAGNOSIS — Z86010 Personal history of colonic polyps: Secondary | ICD-10-CM | POA: Diagnosis not present

## 2018-02-18 DIAGNOSIS — D123 Benign neoplasm of transverse colon: Secondary | ICD-10-CM | POA: Diagnosis not present

## 2018-02-18 DIAGNOSIS — E7800 Pure hypercholesterolemia, unspecified: Secondary | ICD-10-CM | POA: Insufficient documentation

## 2018-02-18 DIAGNOSIS — I129 Hypertensive chronic kidney disease with stage 1 through stage 4 chronic kidney disease, or unspecified chronic kidney disease: Secondary | ICD-10-CM | POA: Diagnosis not present

## 2018-02-18 DIAGNOSIS — D120 Benign neoplasm of cecum: Secondary | ICD-10-CM | POA: Insufficient documentation

## 2018-02-18 DIAGNOSIS — K5520 Angiodysplasia of colon without hemorrhage: Secondary | ICD-10-CM | POA: Diagnosis not present

## 2018-02-18 DIAGNOSIS — Z09 Encounter for follow-up examination after completed treatment for conditions other than malignant neoplasm: Secondary | ICD-10-CM | POA: Diagnosis not present

## 2018-02-18 DIAGNOSIS — K219 Gastro-esophageal reflux disease without esophagitis: Secondary | ICD-10-CM | POA: Insufficient documentation

## 2018-02-18 DIAGNOSIS — J449 Chronic obstructive pulmonary disease, unspecified: Secondary | ICD-10-CM | POA: Insufficient documentation

## 2018-02-18 DIAGNOSIS — Z7984 Long term (current) use of oral hypoglycemic drugs: Secondary | ICD-10-CM | POA: Diagnosis not present

## 2018-02-18 DIAGNOSIS — Z7982 Long term (current) use of aspirin: Secondary | ICD-10-CM | POA: Insufficient documentation

## 2018-02-18 HISTORY — DX: Chronic obstructive pulmonary disease, unspecified: J44.9

## 2018-02-18 HISTORY — DX: Gastro-esophageal reflux disease without esophagitis: K21.9

## 2018-02-18 HISTORY — PX: COLONOSCOPY WITH PROPOFOL: SHX5780

## 2018-02-18 HISTORY — DX: Sleep apnea, unspecified: G47.30

## 2018-02-18 HISTORY — DX: Dyspnea, unspecified: R06.00

## 2018-02-18 HISTORY — DX: Chronic kidney disease, unspecified: N18.9

## 2018-02-18 LAB — GLUCOSE, CAPILLARY: Glucose-Capillary: 168 mg/dL — ABNORMAL HIGH (ref 70–99)

## 2018-02-18 SURGERY — COLONOSCOPY WITH PROPOFOL
Anesthesia: General

## 2018-02-18 MED ORDER — PROPOFOL 500 MG/50ML IV EMUL
INTRAVENOUS | Status: DC | PRN
  Administered 2018-02-18: 150 ug/kg/min via INTRAVENOUS

## 2018-02-18 MED ORDER — FENTANYL CITRATE (PF) 100 MCG/2ML IJ SOLN
INTRAMUSCULAR | Status: AC
  Filled 2018-02-18: qty 2

## 2018-02-18 MED ORDER — LIDOCAINE HCL (PF) 2 % IJ SOLN
INTRAMUSCULAR | Status: AC
  Filled 2018-02-18: qty 10

## 2018-02-18 MED ORDER — SODIUM CHLORIDE 0.9 % IV SOLN: INTRAVENOUS | Status: DC

## 2018-02-18 MED ORDER — FENTANYL CITRATE (PF) 100 MCG/2ML IJ SOLN
INTRAMUSCULAR | Status: DC | PRN
  Administered 2018-02-18: 25 ug via INTRAVENOUS

## 2018-02-18 MED ORDER — PROPOFOL 10 MG/ML IV BOLUS
INTRAVENOUS | Status: DC | PRN
  Administered 2018-02-18: 30 mg via INTRAVENOUS
  Administered 2018-02-18: 40 mg via INTRAVENOUS

## 2018-02-18 MED ORDER — PROPOFOL 500 MG/50ML IV EMUL
INTRAVENOUS | Status: AC
  Filled 2018-02-18: qty 50

## 2018-02-18 MED ORDER — SODIUM CHLORIDE 0.9 % IV SOLN
INTRAVENOUS | Status: DC
  Administered 2018-02-18 (×2): via INTRAVENOUS

## 2018-02-18 NOTE — Anesthesia Postprocedure Evaluation (Signed)
Anesthesia Post Note  Patient: Jenna Olson  Procedure(s) Performed: COLONOSCOPY WITH PROPOFOL (N/A )  Patient location during evaluation: Endoscopy Anesthesia Type: General Level of consciousness: awake and alert Pain management: pain level controlled Vital Signs Assessment: post-procedure vital signs reviewed and stable Respiratory status: spontaneous breathing, nonlabored ventilation, respiratory function stable and patient connected to nasal cannula oxygen Cardiovascular status: blood pressure returned to baseline and stable Postop Assessment: no apparent nausea or vomiting Anesthetic complications: no     Last Vitals:  Vitals:   02/18/18 0950 02/18/18 1000  BP: 129/63 (!) 135/117  Pulse: 65 63  Resp: 19 12  Temp:    SpO2: 93% 94%    Last Pain:  Vitals:   02/18/18 1000  TempSrc:   PainSc: 0-No pain                 Precious Haws Piscitello

## 2018-02-18 NOTE — Anesthesia Post-op Follow-up Note (Signed)
Anesthesia QCDR form completed.        

## 2018-02-18 NOTE — H&P (Signed)
Outpatient short stay form Pre-procedure 02/18/2018 8:26 AM Jenna Sails MD  Primary Physician: Dr. Deliah Goody  Reason for visit: Colonoscopy  History of present illness: Patient is a 73 year old female presenting today for colonoscopy in regards to her personal history of adenomatous colon polyps.  She has no rectal bleeding abdominal pain or diarrhea.  Last colonoscopy was 05/03/2014 with finding of 3 adenomas removed at that time.  Tolerated her prep well.  She takes no aspirin or blood thinning agent with the exception of 81 mg aspirin.    Current Facility-Administered Medications:  .  0.9 %  sodium chloride infusion, , Intravenous, Continuous, Jenna Sails, MD, Last Rate: 20 mL/hr at 02/18/18 0806 .  0.9 %  sodium chloride infusion, , Intravenous, Continuous, Jenna Sails, MD  Medications Prior to Admission  Medication Sig Dispense Refill Last Dose  . acetaminophen (TYLENOL) 500 MG tablet Take 500 mg by mouth every 6 (six) hours as needed.   Past Week at Unknown time  . aspirin EC 81 MG tablet Take by mouth.   Past Week at Unknown time  . azelastine (ASTELIN) 0.1 % nasal spray Place into the nose.   Past Week at Unknown time  . cetirizine (ZYRTEC) 10 MG tablet Take by mouth.   Past Week at Unknown time  . cholecalciferol (VITAMIN D) 400 UNITS TABS tablet Take 400 Units by mouth daily.   Past Week at Unknown time  . diphenhydrAMINE (BENADRYL) 25 MG tablet Take 25 mg by mouth at bedtime as needed.    Past Week at Unknown time  . diphenhydrAMINE (SOMINEX) 25 MG tablet Take 25 mg by mouth at bedtime as needed for sleep.   Past Week at Unknown time  . esomeprazole (NEXIUM) 40 MG capsule Take 40 mg by mouth daily at 12 noon.   Past Week at Unknown time  . fexofenadine (ALLEGRA) 180 MG tablet Take 180 mg by mouth daily.   Past Week at Unknown time  . fluticasone (FLONASE) 50 MCG/ACT nasal spray USE 1-2 SPRAYS IN EACH NOSTRIL ONCE DAILY AS NEEDED ALLERGIES   Past Week at  Unknown time  . furosemide (LASIX) 20 MG tablet Take by mouth.   Past Week at Unknown time  . gabapentin (NEURONTIN) 800 MG tablet 800 mg nightly.    Past Week at Unknown time  . glimepiride (AMARYL) 2 MG tablet    Past Week at Unknown time  . hydrALAZINE (APRESOLINE) 50 MG tablet Take by mouth.   Past Week at Unknown time  . HYDROcodone-acetaminophen (NORCO/VICODIN) 5-325 MG tablet Take by mouth.   Past Week at Unknown time  . irbesartan (AVAPRO) 300 MG tablet    Past Week at Unknown time  . lubiprostone (AMITIZA) 24 MCG capsule Take by mouth.   Past Week at Unknown time  . meclizine (ANTIVERT) 25 MG tablet    Past Week at Unknown time  . metFORMIN (GLUCOPHAGE) 500 MG tablet Take by mouth.   Past Week at Unknown time  . niacin (NIASPAN) 500 MG CR tablet 500 mg at bedtime.    Past Week at Unknown time  . pravastatin (PRAVACHOL) 40 MG tablet Take by mouth.   Past Week at Unknown time  . psyllium (METAMUCIL) 0.52 g capsule Take by mouth.   Past Week at Unknown time  . rOPINIRole (REQUIP) 1 MG tablet Take by mouth.   Past Week at Unknown time  . saxagliptin HCl (ONGLYZA) 5 MG TABS tablet Take by mouth.  Past Week at Unknown time  . solifenacin (VESICARE) 5 MG tablet Take 1 tablet (5 mg total) by mouth daily. 90 tablet 4 Past Week at Unknown time  . traMADol (ULTRAM) 50 MG tablet Take by mouth.   Past Week at Unknown time  . traZODone (DESYREL) 50 MG tablet    Past Week at Unknown time  . valsartan (DIOVAN) 320 MG tablet 320 mg daily.    Past Week at Unknown time  . venlafaxine XR (EFFEXOR-XR) 75 MG 24 hr capsule 75 mg daily with breakfast.    Past Week at Unknown time  . vitamin B-12 (CYANOCOBALAMIN) 250 MCG tablet Take 250 mcg by mouth daily.   Past Week at Unknown time  . albuterol (PROAIR HFA) 108 (90 BASE) MCG/ACT inhaler Inhale into the lungs.   Taking  . carvedilol (COREG) 3.125 MG tablet Take by mouth.   Not Taking  . ferrous sulfate 325 (65 FE) MG tablet Take 325 mg by mouth daily with  breakfast.   Taking     Allergies  Allergen Reactions  . Atorvastatin Other (See Comments)    Elevated lft's  . Hydrochlorothiazide Other (See Comments)    Hyponatremia  . Sulfa Antibiotics Other (See Comments)  . Amlodipine Swelling     Past Medical History:  Diagnosis Date  . Cancer ([place])    skin  . Chronic kidney disease   . COPD (chronic obstructive pulmonary disease) ([place])   . Diabetes mellitus without complication ([place])   . Dyspnea   . Dysuria   . GERD (gastroesophageal reflux disease)   . High cholesterol   . Hypertension   . Incontinence   . Proteinuria   . Sleep apnea   . Urinary frequency   . Urinary urgency   . UTI (lower urinary tract infection)     Review of systems:      Physical Exam    Heart and lungs: Regular rate and rhythm without rub or gallop, lungs are bilaterally clear    HEENT: Normocephalic atraumatic eyes are anicteric    Other:    Pertinant exam for procedure: Soft nontender nondistended bowel sounds positive normoactive    Planned proceedures: Colonoscopy and indicated procedures. I have discussed the risks benefits and complications of procedures to include not limited to bleeding, infection, perforation and the risk of sedation and the patient wishes to proceed.    Jenna Sails, MD Gastroenterology 02/18/2018  8:26 AM

## 2018-02-18 NOTE — Anesthesia Preprocedure Evaluation (Signed)
Anesthesia Evaluation  Patient identified by MRN, date of birth, ID band Patient awake    Reviewed: Allergy & Precautions, H&P , NPO status , Patient's Chart, lab work & pertinent test results  History of Anesthesia Complications Negative for: history of anesthetic complications  Airway Mallampati: III  TM Distance: <3 FB Neck ROM: limited    Dental  (+) Lower Dentures, Upper Dentures, Missing   Pulmonary neg shortness of breath, sleep apnea , COPD, former smoker,           Cardiovascular Exercise Tolerance: Good hypertension, (-) angina(-) Past MI and (-) DOE      Neuro/Psych negative neurological ROS  negative psych ROS   GI/Hepatic Neg liver ROS, GERD  Medicated and Controlled,  Endo/Other  diabetes, Type 2  Renal/GU Renal disease  negative genitourinary   Musculoskeletal   Abdominal   Peds  Hematology negative hematology ROS (+)   Anesthesia Other Findings Past Medical History: No date: Cancer ([place])     Comment:  skin No date: Chronic kidney disease No date: COPD (chronic obstructive pulmonary disease) ([facility]) No date: Diabetes mellitus without complication ([facility]) No date: Dyspnea No date: Dysuria No date: GERD (gastroesophageal reflux disease) No date: High cholesterol No date: Hypertension No date: Incontinence No date: Proteinuria No date: Sleep apnea No date: Urinary frequency No date: Urinary urgency No date: UTI (lower urinary tract infection)  Past Surgical History: No date: ABDOMINAL HYSTERECTOMY No date: BREAST BIOPSY; Left     Comment:  neg No date: CHOLECYSTECTOMY No date: MEDIAL PARTIAL KNEE REPLACEMENT; Right No date: PARTIAL HYSTERECTOMY No date: TONSILLECTOMY  BMI    Body Mass Index:  35.30 kg/m      Reproductive/Obstetrics negative OB ROS                             Anesthesia Physical Anesthesia Plan  ASA: III  Anesthesia Plan: General    Post-op Pain Management:    Induction: Intravenous  PONV Risk Score and Plan: Propofol infusion and TIVA  Airway Management Planned: Natural Airway and Nasal Cannula  Additional Equipment:   Intra-op Plan:   Post-operative Plan:   Informed Consent: I have reviewed the patients History and Physical, chart, labs and discussed the procedure including the risks, benefits and alternatives for the proposed anesthesia with the patient or authorized representative who has indicated his/her understanding and acceptance.     Dental Advisory Given  Plan Discussed with: Anesthesiologist, CRNA and Surgeon  Anesthesia Plan Comments: (Patient consented for risks of anesthesia including but not limited to:  - adverse reactions to medications - risk of intubation if required - damage to teeth, lips or other oral mucosa - sore throat or hoarseness - Damage to heart, brain, lungs or loss of life  Patient voiced understanding.)        Anesthesia Quick Evaluation

## 2018-02-18 NOTE — Op Note (Signed)
[facility] Gastroenterology Patient Name: Jenna Olson Procedure Date: 02/18/2018 8:23 AM MRN: [id number] Account #: [id number] Date of Birth: October 15, 1945 Admit Type: Outpatient Age: 73 Room: [facility] ENDO ROOM 2 Gender: Female Note Status: Finalized Procedure:            Colonoscopy Indications:          Personal history of colonic polyps Providers:            Lollie Sails, MD Referring MD:         Christena Flake. Raechel Ache, MD (Referring MD) Medicines:            Monitored Anesthesia Care Complications:        No immediate complications. Procedure:            Pre-Anesthesia Assessment:                       - ASA Grade Assessment: III - A patient with severe                        systemic disease.                       After obtaining informed consent, the colonoscope was                        passed under direct vision. Throughout the procedure,                        the patient's blood pressure, pulse, and oxygen                        saturations were monitored continuously. The                        Colonoscope was introduced through the anus and                        advanced to the the cecum, identified by appendiceal                        orifice and ileocecal valve. The colonoscopy was                        performed without difficulty. The patient tolerated the                        procedure well. The quality of the bowel preparation                        was good. Findings:      A few small-mouthed diverticula were found in the sigmoid colon and       descending colon.      A 5 mm polyp was found in the cecum. The polyp was sessile. The polyp       was removed with a piecemeal technique using a cold biopsy forceps.       Resection and retrieval were complete.      Six sessile polyps were found in the proximal transverse colon. The       polyps were 2 to 7 mm in size. 3 of these polyps were removed with a  cold snare, 3 removed with a cold  forcep. Resection and retrieval were       complete.      A few small angioectasias without bleeding were found in the cecum, deep       to the mucosa, no stigmata of bleeding. Impression:           - Diverticulosis in the sigmoid colon and in the                        descending colon.                       - One 5 mm polyp in the cecum, removed piecemeal using                        a cold biopsy forceps. Resected and retrieved.                       - Six 2 to 7 mm polyps in the proximal transverse                        colon, removed with a cold snare. Resected and                        retrieved.                       - A few non-bleeding colonic angioectasias. Recommendation:       - Discharge patient to home.                       - Await pathology results.                       - Telephone GI clinic for pathology results in 1 week. Lollie Sails, MD 02/18/2018 9:27:29 AM This report has been signed electronically. Number of Addenda: 0 Note Initiated On: 02/18/2018 8:23 AM Scope Withdrawal Time: 0 hours 23 minutes 53 seconds  Total Procedure Duration: 0 hours 35 minutes 43 seconds       [facility]

## 2018-02-18 NOTE — Transfer of Care (Signed)
Immediate Anesthesia Transfer of Care Note  Patient: Gillie Fleites  Procedure(s) Performed: COLONOSCOPY WITH PROPOFOL (N/A )  Patient Location: PACU  Anesthesia Type:General  Level of Consciousness: sedated  Airway & Oxygen Therapy: Patient Spontanous Breathing and Patient connected to nasal cannula oxygen  Post-op Assessment: Report given to RN and Post -op Vital signs reviewed and stable  Post vital signs: Reviewed and stable  Last Vitals:  Vitals Value Taken Time  BP 110/50 02/18/2018  9:30 AM  Temp 37 C 02/18/2018  9:30 AM  Pulse 62 02/18/2018  9:30 AM  Resp 16 02/18/2018  9:30 AM  SpO2 99 % 02/18/2018  9:30 AM    Last Pain:  Vitals:   02/18/18 0930  TempSrc: Tympanic  PainSc: Asleep         Complications: No apparent anesthesia complications

## 2018-02-19 ENCOUNTER — Encounter: Payer: Self-pay | Admitting: Gastroenterology

## 2018-02-19 LAB — SURGICAL PATHOLOGY

## 2018-07-10 DIAGNOSIS — G629 Polyneuropathy, unspecified: Secondary | ICD-10-CM | POA: Insufficient documentation

## 2019-05-12 ENCOUNTER — Other Ambulatory Visit: Payer: Self-pay | Admitting: Internal Medicine

## 2019-05-12 DIAGNOSIS — Z1231 Encounter for screening mammogram for malignant neoplasm of breast: Secondary | ICD-10-CM

## 2019-06-10 ENCOUNTER — Ambulatory Visit
Admission: RE | Admit: 2019-06-10 | Discharge: 2019-06-10 | Disposition: A | Discharge status: Home / Self Care | Payer: Medicare PPO | Source: Ambulatory Visit | Attending: Internal Medicine | Admitting: Internal Medicine

## 2019-06-10 ENCOUNTER — Other Ambulatory Visit: Payer: Self-pay

## 2019-06-10 DIAGNOSIS — Z1231 Encounter for screening mammogram for malignant neoplasm of breast: Secondary | ICD-10-CM

## 2020-03-21 ENCOUNTER — Other Ambulatory Visit: Payer: Self-pay | Admitting: Nephrology

## 2020-03-21 DIAGNOSIS — E1122 Type 2 diabetes mellitus with diabetic chronic kidney disease: Secondary | ICD-10-CM

## 2020-04-07 ENCOUNTER — Other Ambulatory Visit: Payer: Self-pay

## 2020-04-07 ENCOUNTER — Ambulatory Visit
Admission: RE | Admit: 2020-04-07 | Discharge: 2020-04-07 | Disposition: A | Discharge status: Home / Self Care | Payer: Medicare PPO | Source: Ambulatory Visit | Attending: Nephrology | Admitting: Nephrology

## 2020-04-07 DIAGNOSIS — N184 Chronic kidney disease, stage 4 (severe): Secondary | ICD-10-CM | POA: Insufficient documentation

## 2020-04-07 DIAGNOSIS — E1122 Type 2 diabetes mellitus with diabetic chronic kidney disease: Secondary | ICD-10-CM | POA: Diagnosis not present

## 2020-11-03 ENCOUNTER — Other Ambulatory Visit: Payer: Self-pay | Admitting: Specialist

## 2020-11-03 DIAGNOSIS — J849 Interstitial pulmonary disease, unspecified: Secondary | ICD-10-CM

## 2020-11-15 ENCOUNTER — Other Ambulatory Visit: Payer: Self-pay

## 2020-11-15 ENCOUNTER — Ambulatory Visit
Admission: RE | Admit: 2020-11-15 | Discharge: 2020-11-15 | Disposition: A | Discharge status: Home / Self Care | Payer: Medicare PPO | Source: Ambulatory Visit | Attending: Specialist | Admitting: Specialist

## 2020-11-15 DIAGNOSIS — J849 Interstitial pulmonary disease, unspecified: Secondary | ICD-10-CM | POA: Insufficient documentation

## 2021-02-09 ENCOUNTER — Other Ambulatory Visit
Admission: RE | Admit: 2021-02-09 | Discharge: 2021-02-09 | Disposition: A | Discharge status: Home / Self Care | Payer: Medicare PPO | Source: Ambulatory Visit | Attending: Internal Medicine | Admitting: Internal Medicine

## 2021-02-09 DIAGNOSIS — R0602 Shortness of breath: Secondary | ICD-10-CM | POA: Insufficient documentation

## 2021-02-09 DIAGNOSIS — I739 Peripheral vascular disease, unspecified: Secondary | ICD-10-CM | POA: Insufficient documentation

## 2021-02-09 DIAGNOSIS — Z79899 Other long term (current) drug therapy: Secondary | ICD-10-CM | POA: Diagnosis present

## 2021-02-09 DIAGNOSIS — Z01810 Encounter for preprocedural cardiovascular examination: Secondary | ICD-10-CM | POA: Insufficient documentation

## 2021-02-09 LAB — BRAIN NATRIURETIC PEPTIDE: B Natriuretic Peptide: 74 pg/mL (ref 0.0–100.0)

## 2021-02-23 ENCOUNTER — Encounter
Admission: RE | Disposition: A | Discharge status: Home / Self Care | Payer: Self-pay | Source: Home / Self Care | Attending: Internal Medicine

## 2021-02-23 ENCOUNTER — Ambulatory Visit
Admission: RE | Admit: 2021-02-23 | Discharge: 2021-02-23 | Disposition: A | Discharge status: Home / Self Care | Payer: Medicare PPO | Attending: Internal Medicine | Admitting: Internal Medicine

## 2021-02-23 DIAGNOSIS — Z87891 Personal history of nicotine dependence: Secondary | ICD-10-CM | POA: Diagnosis not present

## 2021-02-23 DIAGNOSIS — J449 Chronic obstructive pulmonary disease, unspecified: Secondary | ICD-10-CM | POA: Diagnosis not present

## 2021-02-23 DIAGNOSIS — E1169 Type 2 diabetes mellitus with other specified complication: Secondary | ICD-10-CM | POA: Diagnosis not present

## 2021-02-23 DIAGNOSIS — N183 Chronic kidney disease, stage 3 unspecified: Secondary | ICD-10-CM | POA: Diagnosis not present

## 2021-02-23 DIAGNOSIS — I130 Hypertensive heart and chronic kidney disease with heart failure and stage 1 through stage 4 chronic kidney disease, or unspecified chronic kidney disease: Secondary | ICD-10-CM | POA: Diagnosis not present

## 2021-02-23 DIAGNOSIS — K219 Gastro-esophageal reflux disease without esophagitis: Secondary | ICD-10-CM | POA: Diagnosis not present

## 2021-02-23 DIAGNOSIS — E1122 Type 2 diabetes mellitus with diabetic chronic kidney disease: Secondary | ICD-10-CM | POA: Insufficient documentation

## 2021-02-23 DIAGNOSIS — R0602 Shortness of breath: Secondary | ICD-10-CM | POA: Diagnosis not present

## 2021-02-23 DIAGNOSIS — I739 Peripheral vascular disease, unspecified: Secondary | ICD-10-CM | POA: Diagnosis not present

## 2021-02-23 DIAGNOSIS — E1140 Type 2 diabetes mellitus with diabetic neuropathy, unspecified: Secondary | ICD-10-CM | POA: Diagnosis not present

## 2021-02-23 DIAGNOSIS — I509 Heart failure, unspecified: Secondary | ICD-10-CM | POA: Insufficient documentation

## 2021-02-23 DIAGNOSIS — I208 Other forms of angina pectoris: Secondary | ICD-10-CM

## 2021-02-23 DIAGNOSIS — G4733 Obstructive sleep apnea (adult) (pediatric): Secondary | ICD-10-CM | POA: Diagnosis not present

## 2021-02-23 DIAGNOSIS — E669 Obesity, unspecified: Secondary | ICD-10-CM | POA: Insufficient documentation

## 2021-02-23 DIAGNOSIS — I272 Pulmonary hypertension, unspecified: Secondary | ICD-10-CM | POA: Diagnosis present

## 2021-02-23 DIAGNOSIS — Z6838 Body mass index (BMI) 38.0-38.9, adult: Secondary | ICD-10-CM | POA: Diagnosis not present

## 2021-02-23 DIAGNOSIS — E785 Hyperlipidemia, unspecified: Secondary | ICD-10-CM | POA: Diagnosis not present

## 2021-02-23 HISTORY — PX: RIGHT HEART CATH: CATH118263

## 2021-02-23 SURGERY — RIGHT HEART CATH
Anesthesia: Moderate Sedation

## 2021-02-23 MED ORDER — HYDRALAZINE HCL 20 MG/ML IJ SOLN: 10.0000 mg | INTRAMUSCULAR | Status: DC | PRN

## 2021-02-23 MED ORDER — SODIUM CHLORIDE 0.9 % WEIGHT BASED INFUSION: 1.0000 mL/kg/h | INTRAVENOUS | Status: DC

## 2021-02-23 MED ORDER — SODIUM CHLORIDE 0.9% FLUSH: 3.0000 mL | Freq: Two times a day (BID) | INTRAVENOUS | Status: DC

## 2021-02-23 MED ORDER — LABETALOL HCL 5 MG/ML IV SOLN: 10.0000 mg | INTRAVENOUS | Status: DC | PRN

## 2021-02-23 MED ORDER — SODIUM CHLORIDE 0.9 % WEIGHT BASED INFUSION: 3.0000 mL/kg/h | INTRAVENOUS | Status: AC

## 2021-02-23 MED ORDER — FENTANYL CITRATE (PF) 100 MCG/2ML IJ SOLN
INTRAMUSCULAR | Status: AC
  Filled 2021-02-23: qty 2

## 2021-02-23 MED ORDER — HEPARIN (PORCINE) IN NACL 1000-0.9 UT/500ML-% IV SOLN
INTRAVENOUS | Status: AC
  Filled 2021-02-23: qty 1000

## 2021-02-23 MED ORDER — MIDAZOLAM HCL 2 MG/2ML IJ SOLN
INTRAMUSCULAR | Status: DC | PRN
  Administered 2021-02-23: 1 mg via INTRAVENOUS

## 2021-02-23 MED ORDER — HEPARIN (PORCINE) IN NACL 1000-0.9 UT/500ML-% IV SOLN
INTRAVENOUS | Status: DC | PRN
  Administered 2021-02-23: 500 mL

## 2021-02-23 MED ORDER — SODIUM CHLORIDE 0.9 % IV SOLN: 250.0000 mL | INTRAVENOUS | Status: DC | PRN

## 2021-02-23 MED ORDER — SODIUM CHLORIDE 0.9% FLUSH: 3.0000 mL | INTRAVENOUS | Status: DC | PRN

## 2021-02-23 MED ORDER — ASPIRIN 81 MG PO CHEW
81.0000 mg | CHEWABLE_TABLET | ORAL | Status: AC
  Administered 2021-02-23: 81 mg via ORAL

## 2021-02-23 MED ORDER — ASPIRIN 81 MG PO CHEW
CHEWABLE_TABLET | ORAL | Status: AC
  Filled 2021-02-23: qty 1

## 2021-02-23 MED ORDER — MIDAZOLAM HCL 2 MG/2ML IJ SOLN
INTRAMUSCULAR | Status: AC
  Filled 2021-02-23: qty 2

## 2021-02-23 MED ORDER — FENTANYL CITRATE (PF) 100 MCG/2ML IJ SOLN
INTRAMUSCULAR | Status: DC | PRN
  Administered 2021-02-23: 25 ug via INTRAVENOUS

## 2021-02-23 MED ORDER — ACETAMINOPHEN 325 MG PO TABS: 650.0000 mg | ORAL_TABLET | ORAL | Status: DC | PRN

## 2021-02-23 MED ORDER — SODIUM CHLORIDE 0.9 % IV SOLN: INTRAVENOUS | Status: DC

## 2021-02-23 MED ORDER — SODIUM CHLORIDE 0.9 % WEIGHT BASED INFUSION
1.0000 mL/kg/h | INTRAVENOUS | Status: DC
  Administered 2021-02-23: 1 mL/kg/h via INTRAVENOUS

## 2021-02-23 MED ORDER — ONDANSETRON HCL 4 MG/2ML IJ SOLN: 4.0000 mg | Freq: Four times a day (QID) | INTRAMUSCULAR | Status: DC | PRN

## 2021-02-23 SURGICAL SUPPLY — 5 items
CATH BALLN WEDGE 5F 110CM (CATHETERS) ×1 IMPLANT
DRAPE BRACHIAL (DRAPES) ×1 IMPLANT
GUIDEWIRE EMER 3M J .025X150CM (WIRE) ×1 IMPLANT
PACK CARDIAC CATH (CUSTOM PROCEDURE TRAY) ×2 IMPLANT
SHEATH GLIDE SLENDER 4/5FR (SHEATH) ×1 IMPLANT

## 2021-02-23 NOTE — Discharge Instructions (Signed)
Call Dr Clayborn Bigness office and make appointment for one week.  Remove dressing from right antecubital (bend in arm) tomorrow. Assess for signs of infection. Call Dr Marianna Payment office for concerns. In event site starts bleeding hold pressure for ten minutes then change dressing. In event you have a life threatening event call 911.

## 2021-02-24 ENCOUNTER — Encounter: Payer: Self-pay | Admitting: Internal Medicine

## 2022-03-13 DIAGNOSIS — N183 Chronic kidney disease, stage 3 unspecified: Secondary | ICD-10-CM | POA: Diagnosis not present

## 2022-03-13 DIAGNOSIS — G4733 Obstructive sleep apnea (adult) (pediatric): Secondary | ICD-10-CM | POA: Diagnosis not present

## 2022-03-13 DIAGNOSIS — J449 Chronic obstructive pulmonary disease, unspecified: Secondary | ICD-10-CM | POA: Diagnosis not present

## 2022-03-13 DIAGNOSIS — E669 Obesity, unspecified: Secondary | ICD-10-CM | POA: Diagnosis not present

## 2022-03-13 DIAGNOSIS — E782 Mixed hyperlipidemia: Secondary | ICD-10-CM | POA: Diagnosis not present

## 2022-03-13 DIAGNOSIS — E119 Type 2 diabetes mellitus without complications: Secondary | ICD-10-CM | POA: Diagnosis not present

## 2022-03-13 DIAGNOSIS — I739 Peripheral vascular disease, unspecified: Secondary | ICD-10-CM | POA: Diagnosis not present

## 2022-03-13 DIAGNOSIS — I272 Pulmonary hypertension, unspecified: Secondary | ICD-10-CM | POA: Diagnosis not present

## 2022-03-13 DIAGNOSIS — I10 Essential (primary) hypertension: Secondary | ICD-10-CM | POA: Diagnosis not present

## 2022-03-20 DIAGNOSIS — G4733 Obstructive sleep apnea (adult) (pediatric): Secondary | ICD-10-CM | POA: Diagnosis not present

## 2022-03-20 DIAGNOSIS — J449 Chronic obstructive pulmonary disease, unspecified: Secondary | ICD-10-CM | POA: Diagnosis not present

## 2022-03-26 DIAGNOSIS — K219 Gastro-esophageal reflux disease without esophagitis: Secondary | ICD-10-CM | POA: Diagnosis not present

## 2022-03-26 DIAGNOSIS — K5909 Other constipation: Secondary | ICD-10-CM | POA: Diagnosis not present

## 2022-04-06 DIAGNOSIS — H43813 Vitreous degeneration, bilateral: Secondary | ICD-10-CM | POA: Diagnosis not present

## 2022-04-06 DIAGNOSIS — H524 Presbyopia: Secondary | ICD-10-CM | POA: Diagnosis not present

## 2022-04-06 DIAGNOSIS — E119 Type 2 diabetes mellitus without complications: Secondary | ICD-10-CM | POA: Diagnosis not present

## 2022-04-06 DIAGNOSIS — H16143 Punctate keratitis, bilateral: Secondary | ICD-10-CM | POA: Diagnosis not present

## 2022-04-13 DIAGNOSIS — J449 Chronic obstructive pulmonary disease, unspecified: Secondary | ICD-10-CM | POA: Diagnosis not present

## 2022-04-16 DIAGNOSIS — E1122 Type 2 diabetes mellitus with diabetic chronic kidney disease: Secondary | ICD-10-CM | POA: Diagnosis not present

## 2022-04-16 DIAGNOSIS — R809 Proteinuria, unspecified: Secondary | ICD-10-CM | POA: Diagnosis not present

## 2022-04-16 DIAGNOSIS — I10 Essential (primary) hypertension: Secondary | ICD-10-CM | POA: Diagnosis not present

## 2022-04-16 DIAGNOSIS — N184 Chronic kidney disease, stage 4 (severe): Secondary | ICD-10-CM | POA: Diagnosis not present

## 2022-04-20 DIAGNOSIS — J449 Chronic obstructive pulmonary disease, unspecified: Secondary | ICD-10-CM | POA: Diagnosis not present

## 2022-04-20 DIAGNOSIS — G4733 Obstructive sleep apnea (adult) (pediatric): Secondary | ICD-10-CM | POA: Diagnosis not present

## 2022-05-02 DIAGNOSIS — R202 Paresthesia of skin: Secondary | ICD-10-CM | POA: Diagnosis not present

## 2022-05-02 DIAGNOSIS — R200 Anesthesia of skin: Secondary | ICD-10-CM | POA: Diagnosis not present

## 2022-05-02 DIAGNOSIS — G629 Polyneuropathy, unspecified: Secondary | ICD-10-CM | POA: Diagnosis not present

## 2022-05-02 DIAGNOSIS — G2581 Restless legs syndrome: Secondary | ICD-10-CM | POA: Diagnosis not present

## 2022-05-10 DIAGNOSIS — J449 Chronic obstructive pulmonary disease, unspecified: Secondary | ICD-10-CM | POA: Diagnosis not present

## 2022-05-10 DIAGNOSIS — I272 Pulmonary hypertension, unspecified: Secondary | ICD-10-CM | POA: Diagnosis not present

## 2022-05-10 DIAGNOSIS — G4733 Obstructive sleep apnea (adult) (pediatric): Secondary | ICD-10-CM | POA: Diagnosis not present

## 2022-05-10 DIAGNOSIS — R918 Other nonspecific abnormal finding of lung field: Secondary | ICD-10-CM | POA: Diagnosis not present

## 2022-05-13 DIAGNOSIS — J449 Chronic obstructive pulmonary disease, unspecified: Secondary | ICD-10-CM | POA: Diagnosis not present

## 2022-05-20 DIAGNOSIS — G4733 Obstructive sleep apnea (adult) (pediatric): Secondary | ICD-10-CM | POA: Diagnosis not present

## 2022-05-20 DIAGNOSIS — J449 Chronic obstructive pulmonary disease, unspecified: Secondary | ICD-10-CM | POA: Diagnosis not present

## 2022-06-12 DIAGNOSIS — J301 Allergic rhinitis due to pollen: Secondary | ICD-10-CM | POA: Diagnosis not present

## 2022-06-12 DIAGNOSIS — Z09 Encounter for follow-up examination after completed treatment for conditions other than malignant neoplasm: Secondary | ICD-10-CM | POA: Diagnosis not present

## 2022-06-12 DIAGNOSIS — I1310 Hypertensive heart and chronic kidney disease without heart failure, with stage 1 through stage 4 chronic kidney disease, or unspecified chronic kidney disease: Secondary | ICD-10-CM | POA: Diagnosis not present

## 2022-06-12 DIAGNOSIS — J449 Chronic obstructive pulmonary disease, unspecified: Secondary | ICD-10-CM | POA: Diagnosis not present

## 2022-06-12 DIAGNOSIS — E1151 Type 2 diabetes mellitus with diabetic peripheral angiopathy without gangrene: Secondary | ICD-10-CM | POA: Diagnosis not present

## 2022-06-12 DIAGNOSIS — I272 Pulmonary hypertension, unspecified: Secondary | ICD-10-CM | POA: Diagnosis not present

## 2022-06-12 DIAGNOSIS — G4730 Sleep apnea, unspecified: Secondary | ICD-10-CM | POA: Diagnosis not present

## 2022-06-12 DIAGNOSIS — N183 Chronic kidney disease, stage 3 unspecified: Secondary | ICD-10-CM | POA: Diagnosis not present

## 2022-06-13 DIAGNOSIS — M79605 Pain in left leg: Secondary | ICD-10-CM | POA: Diagnosis not present

## 2022-06-13 DIAGNOSIS — G2581 Restless legs syndrome: Secondary | ICD-10-CM | POA: Diagnosis not present

## 2022-06-13 DIAGNOSIS — M79604 Pain in right leg: Secondary | ICD-10-CM | POA: Diagnosis not present

## 2022-06-13 DIAGNOSIS — I6523 Occlusion and stenosis of bilateral carotid arteries: Secondary | ICD-10-CM | POA: Diagnosis not present

## 2022-06-13 DIAGNOSIS — R200 Anesthesia of skin: Secondary | ICD-10-CM | POA: Diagnosis not present

## 2022-06-13 DIAGNOSIS — J449 Chronic obstructive pulmonary disease, unspecified: Secondary | ICD-10-CM | POA: Diagnosis not present

## 2022-06-13 DIAGNOSIS — R202 Paresthesia of skin: Secondary | ICD-10-CM | POA: Diagnosis not present

## 2022-06-19 ENCOUNTER — Encounter: Payer: Self-pay | Admitting: Oncology

## 2022-07-01 DIAGNOSIS — W19XXXA Unspecified fall, initial encounter: Secondary | ICD-10-CM | POA: Diagnosis not present

## 2022-07-01 DIAGNOSIS — I10 Essential (primary) hypertension: Secondary | ICD-10-CM | POA: Diagnosis not present

## 2022-07-01 DIAGNOSIS — S8992XA Unspecified injury of left lower leg, initial encounter: Secondary | ICD-10-CM | POA: Diagnosis not present

## 2022-07-01 DIAGNOSIS — T148XXA Other injury of unspecified body region, initial encounter: Secondary | ICD-10-CM | POA: Diagnosis not present

## 2022-07-01 DIAGNOSIS — M7989 Other specified soft tissue disorders: Secondary | ICD-10-CM | POA: Diagnosis not present

## 2022-07-09 ENCOUNTER — Other Ambulatory Visit (INDEPENDENT_AMBULATORY_CARE_PROVIDER_SITE_OTHER): Payer: Self-pay | Admitting: Nurse Practitioner

## 2022-07-09 DIAGNOSIS — I6523 Occlusion and stenosis of bilateral carotid arteries: Secondary | ICD-10-CM

## 2022-07-13 DIAGNOSIS — J449 Chronic obstructive pulmonary disease, unspecified: Secondary | ICD-10-CM | POA: Diagnosis not present

## 2022-07-17 ENCOUNTER — Encounter (INDEPENDENT_AMBULATORY_CARE_PROVIDER_SITE_OTHER): Payer: Self-pay | Admitting: Nurse Practitioner

## 2022-07-17 ENCOUNTER — Ambulatory Visit (INDEPENDENT_AMBULATORY_CARE_PROVIDER_SITE_OTHER): Payer: Medicare PPO

## 2022-07-17 ENCOUNTER — Ambulatory Visit (INDEPENDENT_AMBULATORY_CARE_PROVIDER_SITE_OTHER): Payer: Medicare PPO | Admitting: Nurse Practitioner

## 2022-07-17 VITALS — BP 159/71 | HR 68 | Resp 18 | Ht 62.0 in | Wt 175.2 lb

## 2022-07-17 DIAGNOSIS — I6523 Occlusion and stenosis of bilateral carotid arteries: Secondary | ICD-10-CM

## 2022-07-17 DIAGNOSIS — I10 Essential (primary) hypertension: Secondary | ICD-10-CM

## 2022-07-17 DIAGNOSIS — E1140 Type 2 diabetes mellitus with diabetic neuropathy, unspecified: Secondary | ICD-10-CM

## 2022-07-17 DIAGNOSIS — I739 Peripheral vascular disease, unspecified: Secondary | ICD-10-CM | POA: Diagnosis not present

## 2022-07-17 DIAGNOSIS — G2581 Restless legs syndrome: Secondary | ICD-10-CM | POA: Insufficient documentation

## 2022-07-17 NOTE — Progress Notes (Addendum)
Subjective:    Patient ID: Jenna Olson, female    DOB: January 06, 1946, 77 y.o.   MRN: [id number] Chief Complaint  Patient presents with   New Patient (Initial Visit)    NP. carotid/consult. >70% bilateral carotid stenosis by outside [place] 03/08/21. potter    The patient is seen for evaluation of carotid stenosis.  SHe is referred by Dr. Malvin Johns.  The carotid stenosis was identified after ultrasound in 2023 was greater than 70% at that time however there was no follow-up.  The patient denies amaurosis fugax. There is no recent history of TIA symptoms or focal motor deficits. There is no prior documented CVA.  There is no history of migraine headaches. There is no history of seizures.  The patient is taking enteric-coated aspirin 81 mg daily.  No recent shortening of the patient's walking distance or new symptoms consistent with claudication.  No history of rest pain symptoms. No new ulcers or wounds of the lower extremities have occurred.  She has a previous history of iliac artery stents done in 2015.  There is no history of DVT, PE or superficial thrombophlebitis. No recent episodes of angina or shortness of breath documented.   Today noninvasive studies show 80 to 99% stenosis of the left ICA with 60 to 79% stenosis of the right ICA.  There is noted antegrade flow in the bilateral vertebral arteries and normal flow hemodynamics in the bilateral subclavian arteries.     Review of Systems  Cardiovascular:  Positive for leg swelling.  Neurological:  Positive for numbness.  All other systems reviewed and are negative.      Objective:   Physical Exam Vitals reviewed.  HENT:     Head: Normocephalic.  Cardiovascular:     Rate and Rhythm: Normal rate.     Pulses:          Dorsalis pedis pulses are 2+ on the right side and 2+ on the left side.  Pulmonary:     Effort: Pulmonary effort is normal.  Skin:    General: Skin is warm and dry.  Neurological:     Mental Status: She is  alert and oriented to person, place, and time.  Psychiatric:        Mood and Affect: Mood normal.        Behavior: Behavior normal.        Thought Content: Thought content normal.        Judgment: Judgment normal.     BP (!) 159/71 (BP Location: Left Arm)   Pulse 68   Resp 18   Ht 5\' 2"  (1.575 m)   Wt 175 lb 3.2 oz (79.5 kg)   BMI 32.04 kg/m   Past Medical History:  Diagnosis Date   Cancer (HCC)    skin   Chronic kidney disease    COPD (chronic obstructive pulmonary disease) (HCC)    Diabetes mellitus without complication (HCC)    Dyspnea    Dysuria    GERD (gastroesophageal reflux disease)    High cholesterol    Hypertension    Incontinence    Proteinuria    Sleep apnea    Urinary frequency    Urinary urgency    UTI (lower urinary tract infection)     Social History   Socioeconomic History   Marital status: Married    Spouse name: Not on file   Number of children: Not on file   Years of education: Not on file   Highest education level: Not  on file  Occupational History   Not on file  Tobacco Use   Smoking status: Former   Smokeless tobacco: Never   Tobacco comments:    quit 15 years  Vaping Use   Vaping Use: Never used  Substance and Sexual Activity   Alcohol use: No    Alcohol/week: 0.0 standard drinks of alcohol   Drug use: No   Sexual activity: Not on file  Other Topics Concern   Not on file  Social History Narrative   Not on file   Social Determinants of Health   Financial Resource Strain: Not on file  Food Insecurity: Not on file  Transportation Needs: Not on file  Physical Activity: Not on file  Stress: Not on file  Social Connections: Not on file  Intimate Partner Violence: Not on file    Past Surgical History:  Procedure Laterality Date   ABDOMINAL HYSTERECTOMY     BREAST BIOPSY Left    neg   CHOLECYSTECTOMY     COLONOSCOPY WITH PROPOFOL N/A 02/18/2018   Procedure: COLONOSCOPY WITH PROPOFOL;  Surgeon: Christena Deem, MD;   Location: [facility] ENDOSCOPY;  Service: Endoscopy;  Laterality: N/A;   MEDIAL PARTIAL KNEE REPLACEMENT Right    PARTIAL HYSTERECTOMY     RIGHT HEART CATH N/A 02/23/2021   Procedure: RIGHT HEART CATH;  Surgeon: Alwyn Pea, MD;  Location: [facility] INVASIVE CV LAB;  Service: Cardiovascular;  Laterality: N/A;   TONSILLECTOMY      Family History  Problem Relation Age of Onset   Cancer Grandchild        ewing sarcoma   Bladder Cancer Maternal Uncle    Prostate cancer Maternal Uncle    Kidney disease Neg Hx    Breast cancer Neg Hx     Allergies  Allergen Reactions   Atorvastatin Other (See Comments)    Elevated lft's   Hydrochlorothiazide Other (See Comments)    Hyponatremia   Sulfa Antibiotics Other (See Comments)    Child hood allergy   Amlodipine Swelling   Amoxicillin Rash    Rash in her mouth.  Painful.       Latest Ref Rng & Units 01/14/2018    1:44 PM 01/15/2017   10:02 AM 07/10/2016    1:27 PM  CBC  WBC 4.0 - 10.5 K/uL 10.4  6.8  9.5   Hemoglobin 12.0 - 15.0 g/dL 16.1  09.6  04.5   Hematocrit 36.0 - 46.0 % 38.6  37.8  39.4   Platelets 150 - 400 K/uL 339  346  307       CMP     Component Value Date/Time   NA 136 01/27/2013 1123   K 4.0 01/27/2013 1123   CL 100 01/27/2013 1123   CO2 30 01/27/2013 1123   GLUCOSE 150 (H) 01/27/2013 1123   BUN 13 01/27/2013 1123   CREATININE 1.05 01/27/2013 1123   CALCIUM 9.3 01/27/2013 1123   GFRNONAA 55 (L) 01/27/2013 1123     No results found.     Assessment & Plan:   1. Bilateral carotid artery stenosis The patient remains asymptomatic with respect to the carotid stenosis.  However, the patient has now progressed and has a lesion the is >70%.  Initially we attempted to have the patient undergo CT angiogram in order to define the degree of stenosis as well as the anatomic suitability for surgery versus endovascular intervention.  However the patient has significant chronic kidney disease with elevated creatinine levels  making it unsafe  for her to undergo CT angiogram.  Based on this we will proceed with a carotid angiogram which will allow [place] to utilize less contrast dye for evaluation of her carotid disease.  Will plan on angiogram of the left carotid artery given the 80 to 99% stenosis noted on ultrasound with the intention to intervene if it is indeed greater than 75% and her anatomy is suitable.  We discussed that if she does have a stent placed that day she would require an overnight stay.   However if her anatomy is not suitable for endovascular intervention, surgery will be required.  Surgery would not be performed that day but the details will be discussed following her angiogram. If the patient does indeed need surgery, in the form of carotid endarterectomy, cardiac clearance will be required, once cleared the patient will be scheduled for surgery.  I have completed Shared Decision-Making with Jenna Millin Culbrethprior to carotid surgery/intervention. The conversation included: -Discussion of all treatment options including carotid endarterectomy (CEA), carotid artery stenting (which includes transcarotid artery revascularization (TCAR)), and optimal medical therapy (OMT). -Explanation of risks and benefits for each option specific to Jenna Olson's clinical situation. -Integration of clinical guidelines as it relates to the patient's history and comorbidities. -Discussion and incorporation of Jenna Olson and their personal preferences and priorities in choosing a treatment plan plan  The risks, benefits and alternative therapies were reviewed in detail with the patient.  All questions were answered.  The patient agrees to proceed with angiogram of the left carotid artery with stenting if anatomy is suitable.  The patient's NIHSS score is as follows: 0 Mild: 1 - 5 Mild to Moderately Severe: 5 - 14 Severe: 15 - 24 Very Severe: >25  Continue antiplatelet therapy as prescribed. Continue  management of CAD, HTN and Hyperlipidemia. Healthy heart diet, encouraged exercise at least 4 times per week.   2. PAD (peripheral artery disease) (HCC) The patient had bilateral iliac stents placed in 2015.  She does not have any issues with claudication rest pain or ulceration.  She also has strongly palpable pulses.  We can reevaluate this once we have treated her carotid since these are prior to currently.  3. Benign essential hypertension Continue antihypertensive medications as already ordered, these medications have been reviewed and there are no changes at this time.  4. Type 2 diabetes, controlled, with neuropathy (HCC) Continue hypoglycemic medications as already ordered, these medications have been reviewed and there are no changes at this time.  Hgb A1C to be monitored as already arranged by primary service   Current Outpatient Medications on File Prior to Visit  Medication Sig Dispense Refill   acetaminophen (TYLENOL) 500 MG tablet Take 500 mg by mouth every 6 (six) hours as needed for mild pain.     albuterol (VENTOLIN HFA) 108 (90 Base) MCG/ACT inhaler Inhale 1-2 puffs into the lungs every 6 (six) hours as needed for wheezing or shortness of breath.     aspirin EC 81 MG tablet Take 81 mg by mouth daily.     azelastine (ASTELIN) 0.1 % nasal spray Place 1 spray into both nostrils daily.     b complex vitamins capsule Take 1 capsule by mouth daily.     carboxymethylcellulose (REFRESH PLUS) 0.5 % SOLN Place 1 drop into both eyes daily as needed (dry eyes).     Cholecalciferol 25 MCG (1000 UT) capsule Take 1,000 Units by mouth daily.     diclofenac Sodium (VOLTAREN) 1 % GEL  Apply 2 g topically 4 (four) times daily.     diphenhydrAMINE (BENADRYL) 25 MG tablet Take 25 mg by mouth at bedtime.     esomeprazole (NEXIUM) 40 MG capsule Take 40 mg by mouth daily at 12 noon.     furosemide (LASIX) 20 MG tablet Take 20 mg by mouth daily.     gabapentin (NEURONTIN) 300 MG capsule Take 300  mg by mouth at bedtime.     hydrALAZINE (APRESOLINE) 100 MG tablet Take 100 mg by mouth 2 (two) times daily.     irbesartan (AVAPRO) 300 MG tablet Take 300 mg by mouth daily.     labetalol (NORMODYNE) 200 MG tablet Take 200 mg by mouth 2 (two) times daily.     loratadine (CLARITIN) 10 MG tablet Take 10 mg by mouth daily.     magnesium oxide (MAG-OX) 400 MG tablet Take 400 mg by mouth 2 (two) times daily.     methenamine (HIPREX) 1 g tablet Take 1 g by mouth 2 (two) times daily.     omega-3 fish oil (MAXEPA) 1000 MG CAPS capsule Take 1 capsule by mouth daily.     OZEMPIC, 2 MG/DOSE, 8 MG/3ML SOPN Inject 2 mg into the skin once a week.     polycarbophil (FIBERCON) 625 MG tablet Take 625 mg by mouth daily.     pravastatin (PRAVACHOL) 40 MG tablet Take 40 mg by mouth daily.     pregabalin (LYRICA) 100 MG capsule Take 100-200 mg by mouth See admin instructions. Take 100 mg in the AM and afternoon  and 2 capsules (200 mg) at bedtime     rOPINIRole (REQUIP) 1 MG tablet Take 1 mg by mouth at bedtime.     solifenacin (VESICARE) 5 MG tablet Take 1 tablet (5 mg total) by mouth daily. 90 tablet 4   traZODone (DESYREL) 50 MG tablet Take 50 mg by mouth at bedtime.     No current facility-administered medications on file prior to visit.    There are no Patient Instructions on file for this visit. No follow-ups on file.   Georgiana Spinner, NP

## 2022-07-24 DIAGNOSIS — D225 Melanocytic nevi of trunk: Secondary | ICD-10-CM | POA: Diagnosis not present

## 2022-07-24 DIAGNOSIS — D485 Neoplasm of uncertain behavior of skin: Secondary | ICD-10-CM | POA: Diagnosis not present

## 2022-07-24 DIAGNOSIS — L570 Actinic keratosis: Secondary | ICD-10-CM | POA: Diagnosis not present

## 2022-07-24 DIAGNOSIS — Z872 Personal history of diseases of the skin and subcutaneous tissue: Secondary | ICD-10-CM | POA: Diagnosis not present

## 2022-07-24 DIAGNOSIS — Z85828 Personal history of other malignant neoplasm of skin: Secondary | ICD-10-CM | POA: Diagnosis not present

## 2022-07-24 DIAGNOSIS — Z859 Personal history of malignant neoplasm, unspecified: Secondary | ICD-10-CM | POA: Diagnosis not present

## 2022-07-24 DIAGNOSIS — Z86018 Personal history of other benign neoplasm: Secondary | ICD-10-CM | POA: Diagnosis not present

## 2022-07-24 DIAGNOSIS — L578 Other skin changes due to chronic exposure to nonionizing radiation: Secondary | ICD-10-CM | POA: Diagnosis not present

## 2022-07-26 ENCOUNTER — Ambulatory Visit
Admission: RE | Admit: 2022-07-26 | Discharge: 2022-07-26 | Disposition: A | Discharge status: Home / Self Care | Payer: Medicare PPO | Source: Ambulatory Visit | Attending: Nurse Practitioner | Admitting: Nurse Practitioner

## 2022-07-26 DIAGNOSIS — I6523 Occlusion and stenosis of bilateral carotid arteries: Secondary | ICD-10-CM | POA: Diagnosis not present

## 2022-07-26 LAB — POCT I-STAT CREATININE: Creatinine, Ser: 1.8 mg/dL — ABNORMAL HIGH (ref 0.44–1.00)

## 2022-07-26 MED ORDER — IOHEXOL 350 MG/ML SOLN: 75.0000 mL | Freq: Once | INTRAVENOUS | Status: DC | PRN

## 2022-07-30 DIAGNOSIS — E1142 Type 2 diabetes mellitus with diabetic polyneuropathy: Secondary | ICD-10-CM | POA: Diagnosis not present

## 2022-07-30 DIAGNOSIS — E785 Hyperlipidemia, unspecified: Secondary | ICD-10-CM | POA: Diagnosis not present

## 2022-07-30 DIAGNOSIS — E1169 Type 2 diabetes mellitus with other specified complication: Secondary | ICD-10-CM | POA: Diagnosis not present

## 2022-07-30 DIAGNOSIS — I152 Hypertension secondary to endocrine disorders: Secondary | ICD-10-CM | POA: Diagnosis not present

## 2022-07-30 DIAGNOSIS — E1159 Type 2 diabetes mellitus with other circulatory complications: Secondary | ICD-10-CM | POA: Diagnosis not present

## 2022-07-31 ENCOUNTER — Ambulatory Visit (INDEPENDENT_AMBULATORY_CARE_PROVIDER_SITE_OTHER): Payer: Medicare PPO | Admitting: Vascular Surgery

## 2022-08-01 DIAGNOSIS — G2581 Restless legs syndrome: Secondary | ICD-10-CM | POA: Diagnosis not present

## 2022-08-01 DIAGNOSIS — H6591 Unspecified nonsuppurative otitis media, right ear: Secondary | ICD-10-CM | POA: Diagnosis not present

## 2022-08-01 DIAGNOSIS — R200 Anesthesia of skin: Secondary | ICD-10-CM | POA: Diagnosis not present

## 2022-08-01 DIAGNOSIS — I6523 Occlusion and stenosis of bilateral carotid arteries: Secondary | ICD-10-CM | POA: Diagnosis not present

## 2022-08-01 DIAGNOSIS — R202 Paresthesia of skin: Secondary | ICD-10-CM | POA: Diagnosis not present

## 2022-08-01 DIAGNOSIS — M79604 Pain in right leg: Secondary | ICD-10-CM | POA: Diagnosis not present

## 2022-08-01 DIAGNOSIS — M79605 Pain in left leg: Secondary | ICD-10-CM | POA: Diagnosis not present

## 2022-08-01 DIAGNOSIS — N184 Chronic kidney disease, stage 4 (severe): Secondary | ICD-10-CM | POA: Diagnosis not present

## 2022-08-13 DIAGNOSIS — J449 Chronic obstructive pulmonary disease, unspecified: Secondary | ICD-10-CM | POA: Diagnosis not present

## 2022-08-17 ENCOUNTER — Telehealth (INDEPENDENT_AMBULATORY_CARE_PROVIDER_SITE_OTHER): Payer: Self-pay

## 2022-08-17 NOTE — Telephone Encounter (Signed)
Spoke with the patient and she is scheduled with Dr. Wyn Quaker for a left carotid angio on 08/27/22 with a 6:45 am arrival time to the [facility]. Pre-procedure instructions were discussed and will be sent to Mychart and mailed.

## 2022-08-27 ENCOUNTER — Encounter
Admission: AD | Disposition: A | Discharge status: Home / Self Care | Payer: Self-pay | Source: Home / Self Care | Attending: Vascular Surgery

## 2022-08-27 ENCOUNTER — Inpatient Hospital Stay
Admission: AD | Admit: 2022-08-27 | Discharge: 2022-08-28 | DRG: 036 | Disposition: A | Discharge status: Home / Self Care | Payer: Medicare PPO | Attending: Vascular Surgery | Admitting: Vascular Surgery

## 2022-08-27 ENCOUNTER — Other Ambulatory Visit: Payer: Self-pay

## 2022-08-27 ENCOUNTER — Encounter: Payer: Self-pay | Admitting: Vascular Surgery

## 2022-08-27 DIAGNOSIS — Z96651 Presence of right artificial knee joint: Secondary | ICD-10-CM | POA: Diagnosis present

## 2022-08-27 DIAGNOSIS — N189 Chronic kidney disease, unspecified: Secondary | ICD-10-CM | POA: Diagnosis not present

## 2022-08-27 DIAGNOSIS — E7800 Pure hypercholesterolemia, unspecified: Secondary | ICD-10-CM | POA: Diagnosis not present

## 2022-08-27 DIAGNOSIS — I129 Hypertensive chronic kidney disease with stage 1 through stage 4 chronic kidney disease, or unspecified chronic kidney disease: Secondary | ICD-10-CM | POA: Diagnosis not present

## 2022-08-27 DIAGNOSIS — E1151 Type 2 diabetes mellitus with diabetic peripheral angiopathy without gangrene: Secondary | ICD-10-CM | POA: Diagnosis present

## 2022-08-27 DIAGNOSIS — G4730 Sleep apnea, unspecified: Secondary | ICD-10-CM | POA: Diagnosis present

## 2022-08-27 DIAGNOSIS — Z85828 Personal history of other malignant neoplasm of skin: Secondary | ICD-10-CM

## 2022-08-27 DIAGNOSIS — E1122 Type 2 diabetes mellitus with diabetic chronic kidney disease: Secondary | ICD-10-CM | POA: Diagnosis present

## 2022-08-27 DIAGNOSIS — Z880 Allergy status to penicillin: Secondary | ICD-10-CM

## 2022-08-27 DIAGNOSIS — Z90711 Acquired absence of uterus with remaining cervical stump: Secondary | ICD-10-CM | POA: Diagnosis not present

## 2022-08-27 DIAGNOSIS — Z87891 Personal history of nicotine dependence: Secondary | ICD-10-CM

## 2022-08-27 DIAGNOSIS — Z888 Allergy status to other drugs, medicaments and biological substances status: Secondary | ICD-10-CM | POA: Diagnosis not present

## 2022-08-27 DIAGNOSIS — N1832 Chronic kidney disease, stage 3b: Secondary | ICD-10-CM | POA: Diagnosis not present

## 2022-08-27 DIAGNOSIS — Z882 Allergy status to sulfonamides status: Secondary | ICD-10-CM

## 2022-08-27 DIAGNOSIS — I6522 Occlusion and stenosis of left carotid artery: Secondary | ICD-10-CM | POA: Diagnosis present

## 2022-08-27 DIAGNOSIS — Z9049 Acquired absence of other specified parts of digestive tract: Secondary | ICD-10-CM | POA: Diagnosis not present

## 2022-08-27 DIAGNOSIS — I6523 Occlusion and stenosis of bilateral carotid arteries: Secondary | ICD-10-CM | POA: Diagnosis not present

## 2022-08-27 DIAGNOSIS — K219 Gastro-esophageal reflux disease without esophagitis: Secondary | ICD-10-CM | POA: Diagnosis not present

## 2022-08-27 DIAGNOSIS — Z8052 Family history of malignant neoplasm of bladder: Secondary | ICD-10-CM

## 2022-08-27 DIAGNOSIS — J449 Chronic obstructive pulmonary disease, unspecified: Secondary | ICD-10-CM | POA: Diagnosis not present

## 2022-08-27 DIAGNOSIS — Z808 Family history of malignant neoplasm of other organs or systems: Secondary | ICD-10-CM | POA: Diagnosis not present

## 2022-08-27 DIAGNOSIS — Z79899 Other long term (current) drug therapy: Secondary | ICD-10-CM | POA: Diagnosis not present

## 2022-08-27 DIAGNOSIS — Z90710 Acquired absence of both cervix and uterus: Secondary | ICD-10-CM

## 2022-08-27 HISTORY — PX: CAROTID ANGIOGRAPHY: CATH118230

## 2022-08-27 LAB — BUN: BUN: 22 mg/dL (ref 8–23)

## 2022-08-27 LAB — CREATININE, SERUM
Creatinine, Ser: 1.65 mg/dL — ABNORMAL HIGH (ref 0.44–1.00)
GFR, Estimated: 32 mL/min — ABNORMAL LOW (ref >60)

## 2022-08-27 LAB — POCT ACTIVATED CLOTTING TIME: Activated Clotting Time: 256 seconds

## 2022-08-27 LAB — MRSA NEXT GEN BY PCR, NASAL: MRSA by PCR Next Gen: NOT DETECTED

## 2022-08-27 LAB — GLUCOSE, CAPILLARY
Glucose-Capillary: 116 mg/dL — ABNORMAL HIGH (ref 70–99)
Glucose-Capillary: 103 mg/dL — ABNORMAL HIGH (ref 70–99)

## 2022-08-27 SURGERY — CAROTID ANGIOGRAPHY
Anesthesia: Moderate Sedation | Laterality: Left

## 2022-08-27 MED ORDER — IODIXANOL 320 MG/ML IV SOLN
INTRAVENOUS | Status: DC | PRN
  Administered 2022-08-27: 60 mL via INTRA_ARTERIAL

## 2022-08-27 MED ORDER — ROPINIROLE HCL 1 MG PO TABS
1.0000 mg | ORAL_TABLET | Freq: Every day | ORAL | Status: DC
  Administered 2022-08-27: 1 mg via ORAL
  Filled 2022-08-27: qty 1

## 2022-08-27 MED ORDER — MIDAZOLAM HCL 2 MG/2ML IJ SOLN
INTRAMUSCULAR | Status: DC | PRN
  Administered 2022-08-27: 1 mg via INTRAVENOUS

## 2022-08-27 MED ORDER — SODIUM CHLORIDE 0.9 % IV SOLN: INTRAVENOUS | Status: DC

## 2022-08-27 MED ORDER — PREGABALIN 50 MG PO CAPS
100.0000 mg | ORAL_CAPSULE | Freq: Every day | ORAL | Status: DC
  Administered 2022-08-28: 100 mg via ORAL
  Filled 2022-08-27: qty 2

## 2022-08-27 MED ORDER — PHENOL 1.4 % MT LIQD: 1.0000 | OROMUCOSAL | Status: DC | PRN

## 2022-08-27 MED ORDER — CEFAZOLIN SODIUM-DEXTROSE 2-4 GM/100ML-% IV SOLN
2.0000 g | INTRAVENOUS | Status: AC
  Administered 2022-08-27: 2 g via INTRAVENOUS

## 2022-08-27 MED ORDER — HEPARIN SODIUM (PORCINE) 1000 UNIT/ML IJ SOLN
INTRAMUSCULAR | Status: AC
  Filled 2022-08-27: qty 20

## 2022-08-27 MED ORDER — CALCIUM POLYCARBOPHIL 625 MG PO TABS
625.0000 mg | ORAL_TABLET | Freq: Every day | ORAL | Status: DC
  Administered 2022-08-28: 625 mg via ORAL
  Filled 2022-08-27: qty 1

## 2022-08-27 MED ORDER — HYDRALAZINE HCL 50 MG PO TABS: 100.0000 mg | ORAL_TABLET | Freq: Two times a day (BID) | ORAL | Status: DC

## 2022-08-27 MED ORDER — FESOTERODINE FUMARATE ER 4 MG PO TB24
4.0000 mg | ORAL_TABLET | Freq: Every day | ORAL | Status: DC
  Administered 2022-08-28: 4 mg via ORAL
  Filled 2022-08-27: qty 1

## 2022-08-27 MED ORDER — FENTANYL CITRATE (PF) 100 MCG/2ML IJ SOLN
INTRAMUSCULAR | Status: DC | PRN
  Administered 2022-08-27: 50 ug via INTRAVENOUS

## 2022-08-27 MED ORDER — DIPHENHYDRAMINE HCL 50 MG/ML IJ SOLN: 50.0000 mg | Freq: Once | INTRAMUSCULAR | Status: DC | PRN

## 2022-08-27 MED ORDER — CEFAZOLIN SODIUM-DEXTROSE 2-4 GM/100ML-% IV SOLN
2.0000 g | Freq: Three times a day (TID) | INTRAVENOUS | Status: AC
  Administered 2022-08-27 – 2022-08-28 (×2): 2 g via INTRAVENOUS
  Filled 2022-08-27 (×2): qty 100

## 2022-08-27 MED ORDER — ACETAMINOPHEN 500 MG PO TABS: 500.0000 mg | ORAL_TABLET | Freq: Four times a day (QID) | ORAL | Status: DC | PRN

## 2022-08-27 MED ORDER — HYDRALAZINE HCL 20 MG/ML IJ SOLN: 5.0000 mg | INTRAMUSCULAR | Status: DC | PRN

## 2022-08-27 MED ORDER — FAMOTIDINE IN NACL 20-0.9 MG/50ML-% IV SOLN
20.0000 mg | INTRAVENOUS | Status: DC
  Filled 2022-08-27: qty 50

## 2022-08-27 MED ORDER — POLYVINYL ALCOHOL 1.4 % OP SOLN: 1.0000 [drp] | Freq: Every day | OPHTHALMIC | Status: DC | PRN

## 2022-08-27 MED ORDER — CEFAZOLIN SODIUM-DEXTROSE 2-4 GM/100ML-% IV SOLN
INTRAVENOUS | Status: AC
  Filled 2022-08-27: qty 100

## 2022-08-27 MED ORDER — ATROPINE SULFATE 1 MG/10ML IJ SOSY
PREFILLED_SYRINGE | INTRAMUSCULAR | Status: AC
  Filled 2022-08-27: qty 10

## 2022-08-27 MED ORDER — FUROSEMIDE 20 MG PO TABS
20.0000 mg | ORAL_TABLET | Freq: Every day | ORAL | Status: DC
  Administered 2022-08-28: 20 mg via ORAL
  Filled 2022-08-27: qty 1

## 2022-08-27 MED ORDER — ACETAMINOPHEN 325 MG RE SUPP: 325.0000 mg | RECTAL | Status: DC | PRN

## 2022-08-27 MED ORDER — MIDAZOLAM HCL 2 MG/2ML IJ SOLN
INTRAMUSCULAR | Status: AC
  Filled 2022-08-27: qty 2

## 2022-08-27 MED ORDER — FAMOTIDINE 20 MG PO TABS: 40.0000 mg | ORAL_TABLET | Freq: Once | ORAL | Status: DC | PRN

## 2022-08-27 MED ORDER — TRAZODONE HCL 50 MG PO TABS
50.0000 mg | ORAL_TABLET | Freq: Every day | ORAL | Status: DC
  Administered 2022-08-27: 50 mg via ORAL
  Filled 2022-08-27: qty 1

## 2022-08-27 MED ORDER — VITAMIN D 25 MCG (1000 UNIT) PO TABS
1000.0000 [IU] | ORAL_TABLET | Freq: Every day | ORAL | Status: DC
  Administered 2022-08-28: 1000 [IU] via ORAL
  Filled 2022-08-27: qty 1

## 2022-08-27 MED ORDER — SODIUM CHLORIDE 0.9 % IV SOLN
500.0000 mL | Freq: Once | INTRAVENOUS | Status: AC | PRN
  Administered 2022-08-27: 500 mL via INTRAVENOUS

## 2022-08-27 MED ORDER — FENTANYL CITRATE PF 50 MCG/ML IJ SOSY
PREFILLED_SYRINGE | INTRAMUSCULAR | Status: AC
  Filled 2022-08-27: qty 1

## 2022-08-27 MED ORDER — IRBESARTAN 150 MG PO TABS
300.0000 mg | ORAL_TABLET | Freq: Every day | ORAL | Status: DC
  Administered 2022-08-28: 300 mg via ORAL
  Filled 2022-08-27: qty 2

## 2022-08-27 MED ORDER — ATROPINE SULFATE 1 MG/ML IV SOLN
INTRAVENOUS | Status: DC | PRN
  Administered 2022-08-27: 1 mg via INTRAVENOUS

## 2022-08-27 MED ORDER — PREGABALIN 75 MG PO CAPS
200.0000 mg | ORAL_CAPSULE | Freq: Every day | ORAL | Status: DC
  Administered 2022-08-27: 200 mg via ORAL
  Filled 2022-08-27: qty 1

## 2022-08-27 MED ORDER — HEPARIN SODIUM (PORCINE) 1000 UNIT/ML IJ SOLN
INTRAMUSCULAR | Status: DC | PRN
  Administered 2022-08-27: 8000 [IU] via INTRAVENOUS
  Administered 2022-08-27: 2000 [IU] via INTRAVENOUS

## 2022-08-27 MED ORDER — ALUM & MAG HYDROXIDE-SIMETH 200-200-20 MG/5ML PO SUSP: 15.0000 mL | ORAL | Status: DC | PRN

## 2022-08-27 MED ORDER — SODIUM CHLORIDE 0.9 % IV BOLUS
250.0000 mL | Freq: Once | INTRAVENOUS | Status: AC
  Administered 2022-08-27: 250 mL via INTRAVENOUS

## 2022-08-27 MED ORDER — DOPAMINE-DEXTROSE 3.2-5 MG/ML-% IV SOLN
INTRAVENOUS | Status: AC
  Filled 2022-08-27: qty 250

## 2022-08-27 MED ORDER — HYDROMORPHONE HCL 1 MG/ML IJ SOLN: 1.0000 mg | Freq: Once | INTRAMUSCULAR | Status: DC | PRN

## 2022-08-27 MED ORDER — METHYLPREDNISOLONE SODIUM SUCC 125 MG IJ SOLR: 125.0000 mg | Freq: Once | INTRAMUSCULAR | Status: DC | PRN

## 2022-08-27 MED ORDER — METOPROLOL TARTRATE 5 MG/5ML IV SOLN: 2.0000 mg | INTRAVENOUS | Status: DC | PRN

## 2022-08-27 MED ORDER — LABETALOL HCL 200 MG PO TABS: 100.0000 mg | ORAL_TABLET | Freq: Two times a day (BID) | ORAL | Status: DC

## 2022-08-27 MED ORDER — OXYCODONE-ACETAMINOPHEN 5-325 MG PO TABS
1.0000 | ORAL_TABLET | ORAL | Status: DC | PRN
  Administered 2022-08-27: 2 via ORAL
  Filled 2022-08-27: qty 2

## 2022-08-27 MED ORDER — B COMPLEX-C PO TABS
1.0000 | ORAL_TABLET | Freq: Every day | ORAL | Status: DC
  Administered 2022-08-28: 1 via ORAL
  Filled 2022-08-27 (×2): qty 1

## 2022-08-27 MED ORDER — LIDOCAINE-EPINEPHRINE (PF) 1 %-1:200000 IJ SOLN
INTRAMUSCULAR | Status: DC | PRN
  Administered 2022-08-27: 10 mL via INTRADERMAL

## 2022-08-27 MED ORDER — AZELASTINE HCL 0.1 % NA SOLN: 1.0000 | Freq: Every day | NASAL | Status: DC | PRN

## 2022-08-27 MED ORDER — PHENYLEPHRINE HCL-NACL 20-0.9 MG/250ML-% IV SOLN
INTRAVENOUS | Status: AC
  Filled 2022-08-27: qty 250

## 2022-08-27 MED ORDER — POTASSIUM CHLORIDE CRYS ER 20 MEQ PO TBCR: 20.0000 meq | EXTENDED_RELEASE_TABLET | Freq: Every day | ORAL | Status: DC | PRN

## 2022-08-27 MED ORDER — ONDANSETRON HCL 4 MG/2ML IJ SOLN: 4.0000 mg | Freq: Four times a day (QID) | INTRAMUSCULAR | Status: DC | PRN

## 2022-08-27 MED ORDER — LORATADINE 10 MG PO TABS
10.0000 mg | ORAL_TABLET | Freq: Every day | ORAL | Status: DC
  Administered 2022-08-28: 10 mg via ORAL
  Filled 2022-08-27: qty 1

## 2022-08-27 MED ORDER — MAGNESIUM OXIDE 400 MG PO TABS
400.0000 mg | ORAL_TABLET | Freq: Two times a day (BID) | ORAL | Status: DC
  Administered 2022-08-27 – 2022-08-28 (×2): 400 mg via ORAL
  Filled 2022-08-27 (×4): qty 1

## 2022-08-27 MED ORDER — PANTOPRAZOLE SODIUM 40 MG PO TBEC
40.0000 mg | DELAYED_RELEASE_TABLET | Freq: Every day | ORAL | Status: DC
  Administered 2022-08-28: 40 mg via ORAL
  Filled 2022-08-27: qty 1

## 2022-08-27 MED ORDER — METHENAMINE HIPPURATE 1 G PO TABS: 1.0000 g | ORAL_TABLET | Freq: Two times a day (BID) | ORAL | Status: DC

## 2022-08-27 MED ORDER — ASPIRIN 81 MG PO TBEC
81.0000 mg | DELAYED_RELEASE_TABLET | Freq: Every day | ORAL | Status: DC
  Administered 2022-08-28: 81 mg via ORAL
  Filled 2022-08-27: qty 1

## 2022-08-27 MED ORDER — MAGNESIUM SULFATE 2 GM/50ML IV SOLN: 2.0000 g | Freq: Every day | INTRAVENOUS | Status: DC | PRN

## 2022-08-27 MED ORDER — MIDAZOLAM HCL 2 MG/ML PO SYRP: 8.0000 mg | ORAL_SOLUTION | Freq: Once | ORAL | Status: DC | PRN

## 2022-08-27 MED ORDER — ACETAMINOPHEN 325 MG PO TABS
325.0000 mg | ORAL_TABLET | ORAL | Status: DC | PRN
  Administered 2022-08-27: 650 mg via ORAL
  Filled 2022-08-27: qty 2

## 2022-08-27 MED ORDER — HEPARIN (PORCINE) IN NACL 1000-0.9 UT/500ML-% IV SOLN
INTRAVENOUS | Status: DC | PRN
  Administered 2022-08-27: 1000 mL

## 2022-08-27 MED ORDER — CLOPIDOGREL BISULFATE 75 MG PO TABS
75.0000 mg | ORAL_TABLET | Freq: Every day | ORAL | Status: DC
  Administered 2022-08-28: 75 mg via ORAL
  Filled 2022-08-27: qty 1

## 2022-08-27 MED ORDER — LABETALOL HCL 5 MG/ML IV SOLN: 10.0000 mg | INTRAVENOUS | Status: DC | PRN

## 2022-08-27 MED ORDER — OMEGA-3-ACID ETHYL ESTERS 1 G PO CAPS
1.0000 | ORAL_CAPSULE | Freq: Every day | ORAL | Status: DC
  Administered 2022-08-28: 1 g via ORAL
  Filled 2022-08-27: qty 1

## 2022-08-27 MED ORDER — PRAVASTATIN SODIUM 40 MG PO TABS
40.0000 mg | ORAL_TABLET | Freq: Every day | ORAL | Status: DC
  Administered 2022-08-28: 40 mg via ORAL
  Filled 2022-08-27: qty 2
  Filled 2022-08-27: qty 1

## 2022-08-27 MED ORDER — MORPHINE SULFATE (PF) 4 MG/ML IV SOLN: 2.0000 mg | INTRAVENOUS | Status: DC | PRN

## 2022-08-27 MED ORDER — PHENYLEPHRINE 80 MCG/ML (10ML) SYRINGE FOR IV PUSH (FOR BLOOD PRESSURE SUPPORT)
PREFILLED_SYRINGE | INTRAVENOUS | Status: AC
  Filled 2022-08-27: qty 10

## 2022-08-27 MED ORDER — GUAIFENESIN-DM 100-10 MG/5ML PO SYRP: 15.0000 mL | ORAL_SOLUTION | ORAL | Status: DC | PRN

## 2022-08-27 MED ORDER — GABAPENTIN 300 MG PO CAPS
900.0000 mg | ORAL_CAPSULE | Freq: Every day | ORAL | Status: DC
  Administered 2022-08-27: 900 mg via ORAL
  Filled 2022-08-27: qty 3

## 2022-08-27 SURGICAL SUPPLY — 19 items
BALLN VTRAC 4.5X30X135 (BALLOONS) ×1
BALLOON VTRAC 4.5X30X135 (BALLOONS) IMPLANT
CATH ANGIO 5F PIGTAIL 100CM (CATHETERS) IMPLANT
CATH BEACON 5 .035 100 H1 TIP (CATHETERS) IMPLANT
COVER DRAPE FLUORO 36X44 (DRAPES) IMPLANT
COVER PROBE ULTRASOUND 5X96 (MISCELLANEOUS) IMPLANT
DEVICE EMBOSHIELD NAV6 4.0-7.0 (FILTER) IMPLANT
DEVICE SAFEGUARD 24CM (GAUZE/BANDAGES/DRESSINGS) IMPLANT
DEVICE STARCLOSE SE CLOSURE (Vascular Products) IMPLANT
GLIDEWIRE ANGLED SS 035X260CM (WIRE) IMPLANT
KIT CAROTID MANIFOLD (MISCELLANEOUS) IMPLANT
KIT ENCORE 26 ADVANTAGE (KITS) IMPLANT
PACK ANGIOGRAPHY (CUSTOM PROCEDURE TRAY) ×1 IMPLANT
SHEATH BRITE TIP 5FRX11 (SHEATH) IMPLANT
SHEATH SHUTTLE 6FRX80 (SHEATH) IMPLANT
STENT XACT CAR 9-7X40X136 (Permanent Stent) IMPLANT
SYR MEDRAD MARK 7 150ML (SYRINGE) IMPLANT
WIRE G VAS 035X260 STIFF (WIRE) IMPLANT
WIRE GUIDERIGHT .035X150 (WIRE) IMPLANT

## 2022-08-27 NOTE — H&P (Signed)
[facility] Admission History & Physical  MRN : [id number]  Jenna Olson is a 77 y.o. (1945-08-27) female who presents with chief complaint of No chief complaint on file. Marland Kitchen  History of Present Illness: Patient presents today for her carotid angiogram with possible stent placement.  She has chronic kidney disease precluding CT angiogram.  She has multiple duplex studies which suggest high-grade left carotid artery stenosis and at least moderate right carotid artery stenosis.  No focal neurologic symptoms.  Current Facility-Administered Medications  Medication Dose Route Frequency Provider Last Rate Last Admin   0.9 %  sodium chloride infusion   Intravenous Continuous Georgiana Spinner, NP 75 mL/hr at 08/27/22 0736 New Bag at 08/27/22 0736   ceFAZolin (ANCEF) IVPB 2g/100 mL premix  2 g Intravenous 30 min Pre-Op Georgiana Spinner, NP       diphenhydrAMINE (BENADRYL) injection 50 mg  50 mg Intravenous Once PRN Georgiana Spinner, NP       famotidine (PEPCID) tablet 40 mg  40 mg Oral Once PRN Georgiana Spinner, NP       HYDROmorphone (DILAUDID) injection 1 mg  1 mg Intravenous Once PRN Georgiana Spinner, NP       methylPREDNISolone sodium succinate (SOLU-MEDROL) 125 mg/2 mL injection 125 mg  125 mg Intravenous Once PRN Georgiana Spinner, NP       midazolam (VERSED) 2 MG/ML syrup 8 mg  8 mg Oral Once PRN Georgiana Spinner, NP       ondansetron [facility]) injection 4 mg  4 mg Intravenous Q6H PRN Georgiana Spinner, NP        Past Medical History:  Diagnosis Date   Cancer [facility])    skin   Chronic kidney disease    COPD (chronic obstructive pulmonary disease) (HCC)    Diabetes mellitus without complication (HCC)    Dyspnea    Dysuria    GERD (gastroesophageal reflux disease)    High cholesterol    Hypertension    Incontinence    Proteinuria    Sleep apnea    Urinary frequency    Urinary urgency    UTI (lower urinary tract infection)     Past Surgical History:  Procedure  Laterality Date   ABDOMINAL HYSTERECTOMY     BREAST BIOPSY Left    neg   CHOLECYSTECTOMY     COLONOSCOPY WITH PROPOFOL N/A 02/18/2018   Procedure: COLONOSCOPY WITH PROPOFOL;  Surgeon: Christena Deem, MD;  Location: [facility] ENDOSCOPY;  Service: Endoscopy;  Laterality: N/A;   MEDIAL PARTIAL KNEE REPLACEMENT Right    PARTIAL HYSTERECTOMY     RIGHT HEART CATH N/A 02/23/2021   Procedure: RIGHT HEART CATH;  Surgeon: Alwyn Pea, MD;  Location: [facility] INVASIVE CV LAB;  Service: Cardiovascular;  Laterality: N/A;   TONSILLECTOMY       Social History   Tobacco Use   Smoking status: Former   Smokeless tobacco: Never   Tobacco comments:    quit 15 years  Vaping Use   Vaping status: Never Used  Substance Use Topics   Alcohol use: No    Alcohol/week: 0.0 standard drinks of alcohol   Drug use: No     Family History  Problem Relation Age of Onset   Cancer Grandchild        ewing sarcoma   Bladder Cancer Maternal Uncle    Prostate cancer Maternal Uncle    Kidney disease Neg Hx    Breast cancer Neg  Hx     Allergies  Allergen Reactions   Atorvastatin Other (See Comments)    Elevated lft's   Hydrochlorothiazide Other (See Comments)    Hyponatremia   Sulfa Antibiotics Other (See Comments)    Child hood allergy   Amlodipine Swelling   Amoxicillin Rash    Rash in her mouth.  Painful.     REVIEW OF SYSTEMS (Negative unless checked)  Constitutional: [] Weight loss  [] Fever  [] Chills Cardiac: [] Chest pain   [] Chest pressure   [] Palpitations   [] Shortness of breath when laying flat   [] Shortness of breath at rest   [] Shortness of breath with exertion. Vascular:  [] Pain in legs with walking   [] Pain in legs at rest   [] Pain in legs when laying flat   [] Claudication   [] Pain in feet when walking  [] Pain in feet at rest  [] Pain in feet when laying flat   [] History of DVT   [] Phlebitis   [x] Swelling in legs   [] Varicose veins   [] Non-healing ulcers Pulmonary:   [] Uses home oxygen    [] Productive cough   [] Hemoptysis   [] Wheeze  [] COPD   [] Asthma Neurologic:  [] Dizziness  [] Blackouts   [] Seizures   [] History of stroke   [] History of TIA  [] Aphasia   [] Temporary blindness   [] Dysphagia   [] Weakness or numbness in arms   [x] Weakness or numbness in legs Musculoskeletal:  [] Arthritis   [] Joint swelling   [] Joint pain   [] Low back pain Hematologic:  [] Easy bruising  [] Easy bleeding   [] Hypercoagulable state   [] Anemic  [] Hepatitis Gastrointestinal:  [] Blood in stool   [] Vomiting blood  [] Gastroesophageal reflux/heartburn   [] Difficulty swallowing. Genitourinary:  [] Chronic kidney disease   [] Difficult urination  [] Frequent urination  [] Burning with urination   [] Blood in urine Skin:  [] Rashes   [] Ulcers   [] Wounds Psychological:  [] History of anxiety   []  History of major depression.  Physical Examination  Vitals:   08/27/22 0735  BP: (!) 197/58  Pulse: 64  Resp: 16  Temp: 97.8 F (36.6 C)  TempSrc: Oral  SpO2: 90%  Weight: 78.9 kg  Height: 5\' 2"  (1.575 m)   Body mass index is 31.83 kg/m. Gen: WD/WN, NAD Head: [place]/AT, No temporalis wasting.  Ear/Nose/Throat: Hearing grossly intact, nares w/o erythema or drainage, oropharynx w/o Erythema/Exudate,  Eyes: Conjunctiva clear, sclera non-icteric Neck: Trachea midline.  No JVD.  Pulmonary:  Good air movement, respirations not labored, no use of accessory muscles.  Cardiac: RRR, normal S1, S2. Vascular:  Vessel Right Left  Radial Palpable Palpable           Musculoskeletal: M/S 5/5 throughout.  Extremities without ischemic changes.  No deformity or atrophy.  Neurologic: Sensation grossly intact in extremities.  Symmetrical.  Speech is fluent. Motor exam as listed above. Psychiatric: Judgment intact, Mood & affect appropriate for pt's clinical situation. Dermatologic: No rashes or ulcers noted.  No cellulitis or open wounds.      CBC Lab Results  Component Value Date   WBC 10.4 01/14/2018   HGB 12.6 01/14/2018    HCT 38.6 01/14/2018   MCV 88.9 01/14/2018   PLT 339 01/14/2018    BMET    Component Value Date/Time   NA 136 01/27/2013 1123   K 4.0 01/27/2013 1123   CL 100 01/27/2013 1123   CO2 30 01/27/2013 1123   GLUCOSE 150 (H) 01/27/2013 1123   BUN 22 08/27/2022 0716   BUN 13 01/27/2013 1123   CREATININE 1.65 (  H) 08/27/2022 0716   CREATININE 1.05 01/27/2013 1123   CALCIUM 9.3 01/27/2013 1123   GFRNONAA 32 (L) 08/27/2022 0716   GFRNONAA 55 (L) 01/27/2013 1123   GFRAA >60 01/27/2013 1123   Estimated Creatinine Clearance: 27.3 mL/min (A) (by C-G formula based on SCr of 1.65 mg/dL (H)).  COAG No results found for: "INR", "PROTIME"  Radiology No results found.   Assessment/Plan 1. Bilateral carotid artery stenosis The patient remains asymptomatic with respect to the carotid stenosis.  However, the patient has now progressed and has a lesion the is >70%.   Initially we attempted to have the patient undergo CT angiogram in order to define the degree of stenosis as well as the anatomic suitability for surgery versus endovascular intervention.  However the patient has significant chronic kidney disease with elevated creatinine levels making it unsafe for her to undergo CT angiogram.  Based on this we will proceed with a carotid angiogram which will allow [place] to utilize less contrast dye for evaluation of her carotid disease.  Will plan on angiogram of the left carotid artery given the 80 to 99% stenosis noted on ultrasound with the intention to intervene if it is indeed greater than 75% and her anatomy is suitable.  We discussed that if she does have a stent placed that day she would require an overnight stay.     However if her anatomy is not suitable for endovascular intervention, surgery will be required.  Surgery would not be performed that day but the details will be discussed following her angiogram. If the patient does indeed need surgery, in the form of carotid endarterectomy, cardiac  clearance will be required, once cleared the patient will be scheduled for surgery.   I have completed Shared Decision-Making with Jenna Millin Culbrethprior to carotid surgery/intervention. The conversation included: -Discussion of all treatment options including carotid endarterectomy (CEA), carotid artery stenting (which includes transcarotid artery revascularization (TCAR)), and optimal medical therapy (OMT). -Explanation of risks and benefits for each option specific to Jenna Olson's clinical situation. -Integration of clinical guidelines as it relates to the patient's history and comorbidities. -Discussion and incorporation of Jenna Olson and their personal preferences and priorities in choosing a treatment plan plan   The risks, benefits and alternative therapies were reviewed in detail with the patient.  All questions were answered.  The patient agrees to proceed with angiogram of the left carotid artery with stenting if anatomy is suitable.   The patient's NIHSS score is as follows: 0 Mild: 1 - 5 Mild to Moderately Severe: 5 - 14 Severe: 15 - 24 Very Severe: >25  2. PAD (peripheral artery disease) (HCC) The patient had bilateral iliac stents placed in 2015.  She does not have any issues with claudication rest pain or ulceration.  She also has strongly palpable pulses.  We can reevaluate this once we have treated her carotid since these are prior to currently.   3. Benign essential hypertension Continue antihypertensive medications as already ordered, these medications have been reviewed and there are no changes at this time.   4. Type 2 diabetes, controlled, with neuropathy (HCC) Continue hypoglycemic medications as already ordered, these medications have been reviewed and there are no changes at this time.   Festus Barren, MD  08/27/2022 8:07 AM

## 2022-08-27 NOTE — Progress Notes (Signed)
Post Procedure Note:  Jenna Olson is a 77 year old female now status post same-day left carotid endovascular stent placement.  Patient is resting comfortably in bed.  Vitals all remained stable.  Her blood pressure is lower than prior to procedure.  I have held all her blood pressure medications for today. We will restart tomorrow if needed.  Patient has not needed any dopamine.  Patient's right groin is clean dry and intact with PAD device in place.  No hematoma seroma to note.  Plan is to observe overnight and if vitals remain stable overnight and patient ambulates in the morning without any dizziness, nausea, vomiting, or diarrhea, plan is to discharge home.

## 2022-08-27 NOTE — OR Nursing (Signed)
Squeezed ball on command post stent deployment. Bp 202/72, md aware

## 2022-08-27 NOTE — OR Nursing (Signed)
Removed eye glasses and full set dentures left at bedside.

## 2022-08-27 NOTE — Progress Notes (Signed)
Pt started complaining of 4-5 out of 10 pain in front of head. Notified MD, administered prn tylenol. Will continue to monitor.

## 2022-08-27 NOTE — Progress Notes (Signed)
MD made aware of patient blood pressure steadily dropping. See flow sheet, Order for bolus given, will continue to monitor.

## 2022-08-27 NOTE — OR Nursing (Addendum)
Md aware of GFR, 250 bolus NS infusing Room air oxygen when flat pre sedation, 88%, started on 4 liters [place]

## 2022-08-27 NOTE — Progress Notes (Signed)
Went in patients room to take PAD off per order and noticed bleeding, no hematoma present, good strong pulses in lower extremities, MD made aware, per MD leave PAD in place for another 4 hours from 1545. Will continue to monitor

## 2022-08-27 NOTE — Op Note (Signed)
OPERATIVE NOTE DATE: 08/27/2022  PROCEDURE:  Ultrasound guidance for vascular access right femoral artery  Placement of a 9 mm proximal 7 mm distal 4 cm long Exact stent with the use of the NAV-6 embolic protection device in the left carotid artery  PRE-OPERATIVE DIAGNOSIS: 1. High grade left carotid artery stenosis by duplex. 2. CKD precluding CT angiogram  POST-OPERATIVE DIAGNOSIS:  Same as above  SURGEON: Festus Barren, MD  ASSISTANT(S):  none  ANESTHESIA: local/MCS  ESTIMATED BLOOD LOSS:  25 cc  CONTRAST: 60 cc  FLUORO TIME: 5.4 minutes  MODERATE CONSCIOUS SEDATION TIME:  Approximately 47 minutes using 1 mg of Versed and 50 mcg of Fentanyl  FINDING(S): 1.   80% ulcerated left carotid artery stenosis  SPECIMEN(S):   none  INDICATIONS:   Patient is a 77 y.o. female who presents with left carotid artery stenosis.  The patient has CKD precluding CT angiogram, and we planned carotid angiogram with concomitant stenting if the anatomy is appropriate. I have completed Share Decision Making with Jenna Olson prior to surgery.  Conversations included: -Discussion of all treatment options including carotid endarterectomy (CEA), CAS (which includes transcarotid artery revascularization (TCAR)), and optimal medical therapy (OMT)). -Explanation of risks and benefits for each option specific to Jenna Olson's clinical situation. -Integration of clinical guidelines as it relates to the patient's history and co-morbidities -Discussion and incorporation of Jenna Olson and their personal preferences and priorities in choosing a treatment plan.  Patient was able to participate in Shared Decision Making this process.  Risks and benefits were discussed and informed consent was obtained.   DESCRIPTION: After obtaining full informed written consent, the patient was brought back to the vascular suite and placed supine upon the table.  The patient received IV antibiotics  prior to induction. Moderate conscious sedation was administered during a face to face encounter with the patient throughout the procedure with my supervision of the RN administering medicines and monitoring the patients vital signs and mental status throughout from the start of the procedure until the patient was taken to the recovery room.  After obtaining adequate anesthesia, the patient was prepped and draped in the standard fashion.   The right femoral artery was visualized with ultrasound and found to be widely patent. It was then accessed under direct ultrasound guidance without difficulty with a Seldinger needle. A permanent image was recorded. A J-wire was placed and we then placed a 6 French sheath. The patient was then heparinized and a total of 10,000 units of intravenous heparin were given and an ACT was checked to confirm successful anticoagulation. A pigtail catheter was then placed into the ascending aorta. This showed a type I aortic arch with bovine configuration. I then selectively cannulated the left common carotid arter without difficulty with a headhunter catheter and advanced into the mid left common carotid artery.  Cervical and cerebral carotid angiography was then performed. There were no obvious intracranial filling defects with cross filling left to right. The carotid bifurcation demonstrated an 80% ulcerated lesion just above the bifurcation in the proximal internal carotid artery.  I then advanced into the external carotid artery with a Glidewire and the headhunter catheter and then exchanged for the Amplatz Super Stiff wire. Over the Amplatz Super Stiff wire, a 6 [place] shuttle sheath was placed into the mid common carotid artery. I then used the NAV-6  Embolic protection device and crossed the lesion and parked this in the distal internal carotid artery at the  base of the skull.  I then selected a 9 mm proximal, 7 mm distal 4 cm long Exact stent. This was deployed across the lesion  encompassing it in its entirety. A 4.5 mm diameter x 3 cm length balloon was used to post dilate the stent. Only about a 15% residual stenosis was present after angioplasty. Completion angiogram showed normal intracranial filling without new defects. At this point I elected to terminate the procedure. The sheath was removed and StarClose closure device was deployed in the right femoral artery with excellent hemostatic result. The patient was taken to the recovery room in stable condition having tolerated the procedure well.  COMPLICATIONS: none  CONDITION: stable  Festus Barren 08/27/2022 9:22 AM   This note was created with Dragon Medical transcription system. Any errors in dictation are purely unintentional.

## 2022-08-28 ENCOUNTER — Encounter: Payer: Self-pay | Admitting: Vascular Surgery

## 2022-08-28 LAB — BASIC METABOLIC PANEL
Anion gap: 6 (ref 5–15)
BUN: 18 mg/dL (ref 8–23)
CO2: 25 mmol/L (ref 22–32)
Calcium: 8.2 mg/dL — ABNORMAL LOW (ref 8.9–10.3)
Chloride: 102 mmol/L (ref 98–111)
Creatinine, Ser: 1.36 mg/dL — ABNORMAL HIGH (ref 0.44–1.00)
GFR, Estimated: 40 mL/min — ABNORMAL LOW (ref >60)
Glucose, Bld: 118 mg/dL — ABNORMAL HIGH (ref 70–99)
Potassium: 4.7 mmol/L (ref 3.5–5.1)
Sodium: 133 mmol/L — ABNORMAL LOW (ref 135–145)

## 2022-08-28 LAB — CBC
HCT: 33.7 % — ABNORMAL LOW (ref 36.0–46.0)
Hemoglobin: 10.8 g/dL — ABNORMAL LOW (ref 12.0–15.0)
MCH: 29.3 pg (ref 26.0–34.0)
MCHC: 32 g/dL (ref 30.0–36.0)
MCV: 91.6 fL (ref 80.0–100.0)
Platelets: 240 10*3/uL (ref 150–400)
RBC: 3.68 MIL/uL — ABNORMAL LOW (ref 3.87–5.11)
RDW: 14.2 % (ref 11.5–15.5)
WBC: 7.5 10*3/uL (ref 4.0–10.5)
nRBC: 0 % (ref 0.0–0.2)

## 2022-08-28 MED ORDER — CLOPIDOGREL BISULFATE 75 MG PO TABS: 75.0000 mg | ORAL_TABLET | Freq: Every day | ORAL | 11 refills | Status: DC

## 2022-08-28 NOTE — Discharge Instructions (Signed)
No heavy lifting for the next two weeks. Do not lift anything more than a gallon of Milk.   Do not strain going to the bathroom. Take Miralax for 3 days to help relieve constipation.   Follow up with your PCP as soon as possible for blood pressure checks and possible medication adjustment.  You may shower when you get home. Shower with the old dressing in place. Remove immediately after. Replace with a band aid for the next 3 days.   Follow up with Vein and Vascular Surgery as scheduled.

## 2022-08-28 NOTE — Discharge Summary (Signed)
[name]    Discharge Summary    Patient ID:  Jenna Olson MRN: [id number] DOB/AGE: [birth year] 77 y.o.  Admit date: 08/27/2022 Discharge date: 08/28/2022 Date of Surgery: 08/27/2022 Surgeon: Surgeon(s): Dew, Marlow Baars, MD  Admission Diagnosis: Carotid stenosis, left [I65.22]  Discharge Diagnoses:  Carotid stenosis, left [I65.22]  Secondary Diagnoses: Past Medical History:  Diagnosis Date   Cancer (HCC)    skin   Chronic kidney disease    COPD (chronic obstructive pulmonary disease) (HCC)    Diabetes mellitus without complication (HCC)    Dyspnea    Dysuria    GERD (gastroesophageal reflux disease)    High cholesterol    Hypertension    Incontinence    Proteinuria    Sleep apnea    Urinary frequency    Urinary urgency    UTI (lower urinary tract infection)     Procedure(s): CAROTID ANGIOGRAPHY  Discharged Condition: good  HPI:  Jenna Olson is a 77 yo female now status postop day 1 from a left endovascular carotid stent placement.  Patient is resting comfortably in bed this morning.  Patient has ambulated around the room in the unit.  She is eating and urinating well.  Vitals all remained stable.  Patient did not require any blood pressure support or heart rate support overnight.  Patient is requesting to go home.  Patient to be discharged on aspirin 81 mg daily, Plavix 75 mg daily and resume her home pravastatin 40 mg daily.  Patient to be discharged today.  Hospital Course:  Jenna Olson is a 77 y.o. female is S/P Left Extubated: POD # 0 Physical Exam:  Alert notes x3, no acute distress Face: Symmetrical.  Tongue is midline. Neck: Trachea is midline.  No swelling or bruising. Cardiovascular: Regular rate and rhythm Pulmonary: Clear to auscultation bilaterally Abdomen: Soft, nontender, nondistended Right groin access: Clean dry and intact.  No swelling or drainage noted Left lower extremity: Thigh soft.  Calf soft.   Extremities warm distally toes.  Hard to palpate pedal pulses however the foot is warm is her good capillary refill. Right lower extremity: Thigh soft.  Calf soft.  Extremities warm distally toes.  Hard to palpate pedal pulses however the foot is warm is her good capillary refill. Neurological: No deficits noted   Post-op wounds:  clean, dry, intact or healing well  Pt. Ambulating, voiding and taking PO diet without difficulty. Pt pain controlled with PO pain meds.  Labs:  As below  Complications: none  Consults:    Significant Diagnostic Studies: CBC Lab Results  Component Value Date   WBC 7.5 08/28/2022   HGB 10.8 (L) 08/28/2022   HCT 33.7 (L) 08/28/2022   MCV 91.6 08/28/2022   PLT 240 08/28/2022    BMET    Component Value Date/Time   NA 133 (L) 08/28/2022 0542   NA 136 01/27/2013 1123   K 4.7 08/28/2022 0542   K 4.0 01/27/2013 1123   CL 102 08/28/2022 0542   CL 100 01/27/2013 1123   CO2 25 08/28/2022 0542   CO2 30 01/27/2013 1123   GLUCOSE 118 (H) 08/28/2022 0542   GLUCOSE 150 (H) 01/27/2013 1123   BUN 18 08/28/2022 0542   BUN 13 01/27/2013 1123   CREATININE 1.36 (H) 08/28/2022 0542   CREATININE 1.05 01/27/2013 1123   CALCIUM 8.2 (L) 08/28/2022 0542   CALCIUM 9.3 01/27/2013 1123   GFRNONAA 40 (L) 08/28/2022 0542   GFRNONAA 55 (L) 01/27/2013 1123  GFRAA >60 01/27/2013 1123   COAG No results found for: "INR", "PROTIME"   Disposition:  Discharge to :Home  Allergies as of 08/28/2022       Reactions   Atorvastatin Other (See Comments)   Elevated lft's   Hydrochlorothiazide Other (See Comments)   Hyponatremia   Sulfa Antibiotics Other (See Comments)   Child hood allergy   Amlodipine Swelling   Amoxicillin Rash   Rash in her mouth.  Painful.        Medication List     TAKE these medications    acetaminophen 500 MG tablet Commonly known as: TYLENOL Take 500 mg by mouth every 6 (six) hours as needed for mild pain.   aspirin EC 81 MG  tablet Take 81 mg by mouth daily.   azelastine 0.1 % nasal spray Commonly known as: ASTELIN Place 1 spray into both nostrils daily.   b complex vitamins capsule Take 1 capsule by mouth daily.   carboxymethylcellulose 0.5 % Soln Commonly known as: REFRESH PLUS Place 1 drop into both eyes daily as needed (dry eyes).   Cholecalciferol 25 MCG (1000 UT) capsule Take 1,000 Units by mouth daily.   clopidogrel 75 MG tablet Commonly known as: PLAVIX Take 1 tablet (75 mg total) by mouth daily at 6 (six) AM. Start taking on: August 29, 2022   esomeprazole 40 MG capsule Commonly known as: NEXIUM Take 40 mg by mouth daily at 12 noon.   furosemide 20 MG tablet Commonly known as: LASIX Take 20 mg by mouth daily.   gabapentin 300 MG capsule Commonly known as: NEURONTIN Take 300 mg by mouth at bedtime.   hydrALAZINE 100 MG tablet Commonly known as: APRESOLINE Take 100 mg by mouth 2 (two) times daily.   irbesartan 300 MG tablet Commonly known as: AVAPRO Take 300 mg by mouth daily.   labetalol 200 MG tablet Commonly known as: NORMODYNE Take 200 mg by mouth 2 (two) times daily.   loratadine 10 MG tablet Commonly known as: CLARITIN Take 10 mg by mouth daily.   magnesium oxide 400 MG tablet Commonly known as: MAG-OX Take 400 mg by mouth 2 (two) times daily.   methenamine 1 g tablet Commonly known as: HIPREX Take 1 g by mouth 2 (two) times daily.   omega-3 fish oil 1000 MG Caps capsule Commonly known as: MAXEPA Take 1 capsule by mouth daily.   Ozempic (2 MG/DOSE) 8 MG/3ML Sopn Generic drug: Semaglutide (2 MG/DOSE) Inject 2 mg into the skin once a week.   polycarbophil 625 MG tablet Commonly known as: FIBERCON Take 625 mg by mouth daily.   pravastatin 40 MG tablet Commonly known as: PRAVACHOL Take 40 mg by mouth daily.   pregabalin 100 MG capsule Commonly known as: LYRICA Take 100-200 mg by mouth See admin instructions. Take 100 mg in the AM and afternoon  and 2  capsules (200 mg) at bedtime   rOPINIRole 1 MG tablet Commonly known as: REQUIP Take 1 mg by mouth at bedtime.   solifenacin 5 MG tablet Commonly known as: VESICARE Take 1 tablet (5 mg total) by mouth daily.   traZODone 50 MG tablet Commonly known as: DESYREL Take 50 mg by mouth at bedtime.       Verbal and written Discharge instructions given to the patient. Wound care per Discharge AVS  Follow-up Information     Georgiana Spinner, NP Follow up in 3 week(s).   Specialty: Vascular Surgery Why: with carotid duplex Contact information: [address] [place] [address] [phone]         Annice Needy, MD Follow up in 1 month(s).   Specialties: Vascular Surgery, Radiology, Interventional Cardiology Why: Carotid ultrasound. Bilateral Contact information: [address] [place] [address] [phone]                 Signed: Marcie Bal, NP  08/28/2022, 9:19 AM

## 2022-08-28 NOTE — Plan of Care (Signed)
Pt being discharged home now. Stated understanding to discharge instructions. VSS, denies any or pain at this time.

## 2022-09-08 ENCOUNTER — Emergency Department: Payer: Medicare PPO

## 2022-09-08 ENCOUNTER — Other Ambulatory Visit: Payer: Self-pay

## 2022-09-08 ENCOUNTER — Emergency Department
Admission: EM | Admit: 2022-09-08 | Discharge: 2022-09-08 | Disposition: A | Discharge status: Home / Self Care | Payer: Medicare PPO | Source: Home / Self Care | Attending: Emergency Medicine | Admitting: Emergency Medicine

## 2022-09-08 DIAGNOSIS — R9431 Abnormal electrocardiogram [ECG] [EKG]: Secondary | ICD-10-CM | POA: Diagnosis not present

## 2022-09-08 DIAGNOSIS — N189 Chronic kidney disease, unspecified: Secondary | ICD-10-CM | POA: Diagnosis not present

## 2022-09-08 DIAGNOSIS — Z7982 Long term (current) use of aspirin: Secondary | ICD-10-CM | POA: Insufficient documentation

## 2022-09-08 DIAGNOSIS — I129 Hypertensive chronic kidney disease with stage 1 through stage 4 chronic kidney disease, or unspecified chronic kidney disease: Secondary | ICD-10-CM | POA: Insufficient documentation

## 2022-09-08 DIAGNOSIS — Z9862 Peripheral vascular angioplasty status: Secondary | ICD-10-CM | POA: Insufficient documentation

## 2022-09-08 DIAGNOSIS — Z87891 Personal history of nicotine dependence: Secondary | ICD-10-CM | POA: Diagnosis not present

## 2022-09-08 DIAGNOSIS — Z5181 Encounter for therapeutic drug level monitoring: Secondary | ICD-10-CM | POA: Insufficient documentation

## 2022-09-08 DIAGNOSIS — E1122 Type 2 diabetes mellitus with diabetic chronic kidney disease: Secondary | ICD-10-CM | POA: Insufficient documentation

## 2022-09-08 DIAGNOSIS — Z7985 Long-term (current) use of injectable non-insulin antidiabetic drugs: Secondary | ICD-10-CM | POA: Diagnosis not present

## 2022-09-08 DIAGNOSIS — Z7902 Long term (current) use of antithrombotics/antiplatelets: Secondary | ICD-10-CM | POA: Insufficient documentation

## 2022-09-08 DIAGNOSIS — G459 Transient cerebral ischemic attack, unspecified: Secondary | ICD-10-CM | POA: Diagnosis not present

## 2022-09-08 DIAGNOSIS — I6521 Occlusion and stenosis of right carotid artery: Secondary | ICD-10-CM | POA: Diagnosis not present

## 2022-09-08 DIAGNOSIS — R29818 Other symptoms and signs involving the nervous system: Secondary | ICD-10-CM | POA: Diagnosis not present

## 2022-09-08 DIAGNOSIS — I10 Essential (primary) hypertension: Secondary | ICD-10-CM | POA: Diagnosis not present

## 2022-09-08 DIAGNOSIS — R519 Headache, unspecified: Secondary | ICD-10-CM | POA: Diagnosis not present

## 2022-09-08 DIAGNOSIS — H538 Other visual disturbances: Secondary | ICD-10-CM | POA: Insufficient documentation

## 2022-09-08 LAB — DIFFERENTIAL
Abs Immature Granulocytes: 0.11 10*3/uL — ABNORMAL HIGH (ref 0.00–0.07)
Basophils Absolute: 0.1 10*3/uL (ref 0.0–0.1)
Basophils Relative: 1 %
Eosinophils Absolute: 0.3 10*3/uL (ref 0.0–0.5)
Eosinophils Relative: 3 %
Immature Granulocytes: 1 %
Lymphocytes Relative: 15 %
Lymphs Abs: 1.5 10*3/uL (ref 0.7–4.0)
Monocytes Absolute: 0.7 10*3/uL (ref 0.1–1.0)
Monocytes Relative: 7 %
Neutro Abs: 7.1 10*3/uL (ref 1.7–7.7)
Neutrophils Relative %: 73 %

## 2022-09-08 LAB — CBC
HCT: 39.8 % (ref 36.0–46.0)
Hemoglobin: 12.6 g/dL (ref 12.0–15.0)
MCH: 29 pg (ref 26.0–34.0)
MCHC: 31.7 g/dL (ref 30.0–36.0)
MCV: 91.5 fL (ref 80.0–100.0)
Platelets: 328 10*3/uL (ref 150–400)
RBC: 4.35 MIL/uL (ref 3.87–5.11)
RDW: 14.2 % (ref 11.5–15.5)
WBC: 9.7 10*3/uL (ref 4.0–10.5)
nRBC: 0 % (ref 0.0–0.2)

## 2022-09-08 LAB — COMPREHENSIVE METABOLIC PANEL
ALT: 11 U/L (ref 0–44)
AST: 13 U/L — ABNORMAL LOW (ref 15–41)
Albumin: 4.1 g/dL (ref 3.5–5.0)
Alkaline Phosphatase: 70 U/L (ref 38–126)
Anion gap: 12 (ref 5–15)
BUN: 30 mg/dL — ABNORMAL HIGH (ref 8–23)
CO2: 26 mmol/L (ref 22–32)
Calcium: 9.2 mg/dL (ref 8.9–10.3)
Chloride: 94 mmol/L — ABNORMAL LOW (ref 98–111)
Creatinine, Ser: 2.03 mg/dL — ABNORMAL HIGH (ref 0.44–1.00)
GFR, Estimated: 25 mL/min — ABNORMAL LOW (ref >60)
Glucose, Bld: 107 mg/dL — ABNORMAL HIGH (ref 70–99)
Potassium: 5 mmol/L (ref 3.5–5.1)
Sodium: 132 mmol/L — ABNORMAL LOW (ref 135–145)
Total Bilirubin: 0.7 mg/dL (ref 0.3–1.2)
Total Protein: 7.4 g/dL (ref 6.5–8.1)

## 2022-09-08 LAB — PROTIME-INR
INR: 1 (ref 0.8–1.2)
Prothrombin Time: 12.9 seconds (ref 11.4–15.2)

## 2022-09-08 LAB — APTT: aPTT: 30 seconds (ref 24–36)

## 2022-09-08 LAB — ETHANOL: Alcohol, Ethyl (B): <10 mg/dL (ref <10)

## 2022-09-08 MED ORDER — SODIUM CHLORIDE 0.9 % IV BOLUS
1000.0000 mL | Freq: Once | INTRAVENOUS | Status: AC
  Administered 2022-09-08: 1000 mL via INTRAVENOUS

## 2022-09-08 MED ORDER — SODIUM CHLORIDE 0.9% FLUSH: 3.0000 mL | Freq: Once | INTRAVENOUS | Status: DC

## 2022-09-08 NOTE — Consult Note (Signed)
[facility] Vascular Consult Note  MRN : [id number]  Jenna Olson is a 77 y.o. (1945/01/11) female who presents with chief complaint of  Chief Complaint  Patient presents with   Blurred Vision  .  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 Wyn Quaker.  She was sent home with ASA and Plavix on POD#1.  Three days ago she says she started to notice difficulty focusing her eyes.  It is on both sides and at different times she thought it was right worse than left and then left worse than right.  I had her cover her eyes one at a time and she thinks her left eye is better than her right.  I asked her to come to the ER because I could not be sure over the phone if this was a complication or not of the procedure.  Her niece is a Teacher, early years/pre and she called her and she looked up the side effects and told Jenna Olson that tunnel vision and blurred vision were known side effects.  I looked up Plavix and did not find any vision changes as side effects and have never encountered that.  This was important because she initially called to see if we could stop or change the Plavix.  She has no localizing symptoms.  She does also report dry mouth.  Current Facility-Administered Medications  Medication Dose Route Frequency Provider Last Rate Last Admin   sodium chloride flush (NS) 0.9 % injection 3 mL  3 mL Intravenous Once Willy Eddy, MD       Current Outpatient Medications  Medication Sig Dispense Refill   acetaminophen (TYLENOL) 500 MG tablet Take 500 mg by mouth every 6 (six) hours as needed for mild pain.     aspirin EC 81 MG tablet Take 81 mg by mouth daily.     azelastine (ASTELIN) 0.1 % nasal spray Place 1 spray into both nostrils daily.     b complex vitamins capsule Take 1 capsule by mouth daily.     carboxymethylcellulose (REFRESH PLUS) 0.5 % SOLN Place 1 drop into both eyes daily as needed (dry eyes).      Cholecalciferol 25 MCG (1000 UT) capsule Take 1,000 Units by mouth daily.     clopidogrel (PLAVIX) 75 MG tablet Take 1 tablet (75 mg total) by mouth daily at 6 (six) AM. 30 tablet 11   esomeprazole (NEXIUM) 40 MG capsule Take 40 mg by mouth daily at 12 noon.     furosemide (LASIX) 20 MG tablet Take 20 mg by mouth daily.     gabapentin (NEURONTIN) 300 MG capsule Take 300 mg by mouth at bedtime.     hydrALAZINE (APRESOLINE) 100 MG tablet Take 100 mg by mouth 2 (two) times daily.     irbesartan (AVAPRO) 300 MG tablet Take 300 mg by mouth daily.     labetalol (NORMODYNE) 200 MG tablet Take 200 mg by mouth 2 (two) times daily.     loratadine (CLARITIN) 10 MG tablet Take 10 mg by mouth daily.     magnesium oxide (MAG-OX) 400 MG tablet Take 400 mg by mouth 2 (two) times daily.     methenamine (HIPREX) 1 g tablet Take 1 g by mouth 2 (two) times daily.     omega-3 fish oil (MAXEPA) 1000 MG CAPS capsule Take 1 capsule by mouth daily.     OZEMPIC, 2 MG/DOSE, 8 MG/3ML SOPN Inject 2 mg  into the skin once a week.     polycarbophil (FIBERCON) 625 MG tablet Take 625 mg by mouth daily.     pravastatin (PRAVACHOL) 40 MG tablet Take 40 mg by mouth daily.     pregabalin (LYRICA) 100 MG capsule Take 100-200 mg by mouth See admin instructions. Take 100 mg in the AM and afternoon  and 2 capsules (200 mg) at bedtime     rOPINIRole (REQUIP) 1 MG tablet Take 1 mg by mouth at bedtime.     solifenacin (VESICARE) 5 MG tablet Take 1 tablet (5 mg total) by mouth daily. 90 tablet 4   traZODone (DESYREL) 50 MG tablet Take 50 mg by mouth at bedtime.      Past Medical History:  Diagnosis Date   Cancer (HCC)    skin   Chronic kidney disease    COPD (chronic obstructive pulmonary disease) (HCC)    Diabetes mellitus without complication (HCC)    Dyspnea    Dysuria    GERD (gastroesophageal reflux disease)    High cholesterol    Hypertension    Incontinence    Proteinuria    Sleep apnea    Urinary frequency     Urinary urgency    UTI (lower urinary tract infection)     Past Surgical History:  Procedure Laterality Date   ABDOMINAL HYSTERECTOMY     BREAST BIOPSY Left    neg   CAROTID ANGIOGRAPHY Left 08/27/2022   Procedure: CAROTID ANGIOGRAPHY;  Surgeon: Annice Needy, MD;  Location: [facility] INVASIVE CV LAB;  Service: Cardiovascular;  Laterality: Left;   CHOLECYSTECTOMY     COLONOSCOPY WITH PROPOFOL N/A 02/18/2018   Procedure: COLONOSCOPY WITH PROPOFOL;  Surgeon: Christena Deem, MD;  Location: [facility] ENDOSCOPY;  Service: Endoscopy;  Laterality: N/A;   MEDIAL PARTIAL KNEE REPLACEMENT Right    PARTIAL HYSTERECTOMY     RIGHT HEART CATH N/A 02/23/2021   Procedure: RIGHT HEART CATH;  Surgeon: Alwyn Pea, MD;  Location: [facility] INVASIVE CV LAB;  Service: Cardiovascular;  Laterality: N/A;   TONSILLECTOMY      Social History Social History   Tobacco Use   Smoking status: Former   Smokeless tobacco: Never   Tobacco comments:    quit 15 years  Vaping Use   Vaping status: Never Used  Substance Use Topics   Alcohol use: No    Alcohol/week: 0.0 standard drinks of alcohol   Drug use: No    Family History Family History  Problem Relation Age of Onset   Cancer Grandchild        ewing sarcoma   Bladder Cancer Maternal Uncle    Prostate cancer Maternal Uncle    Kidney disease Neg Hx    Breast cancer Neg Hx     Allergies  Allergen Reactions   Atorvastatin Other (See Comments)    Elevated lft's   Hydrochlorothiazide Other (See Comments)    Hyponatremia   Sulfa Antibiotics Other (See Comments)    Child hood allergy   Amlodipine Swelling   Amoxicillin Rash    Rash in her mouth.  Painful.     REVIEW OF SYSTEMS (Negative unless checked)  Constitutional: [] Weight loss  [] Fever  [] Chills Cardiac: [] Chest pain   [] Chest pressure   [] Palpitations   [] Shortness of breath when laying flat   [] Shortness of breath at rest   [] Shortness of breath with exertion. Vascular:  [] Pain in legs with  walking   [] Pain in legs at rest   [] Pain in  legs when laying flat   [] Claudication   [] Pain in feet when walking  [] Pain in feet at rest  [] Pain in feet when laying flat   [] History of DVT   [] Phlebitis   [] Swelling in legs   [] Varicose veins   [] Non-healing ulcers Pulmonary:   [] Uses home oxygen   [] Productive cough   [] Hemoptysis   [] Wheeze  [] COPD   [] Asthma Neurologic:  [] Dizziness  [] Blackouts   [] Seizures   [] History of stroke   [] History of TIA  [] Aphasia   [] Temporary blindness   [] Dysphagia   [] Weakness or numbness in arms   [] Weakness or numbness in legs Musculoskeletal:  [] Arthritis   [] Joint swelling   [] Joint pain   [] Low back pain Hematologic:  [] Easy bruising  [] Easy bleeding   [] Hypercoagulable state   [] Anemic  [] Hepatitis Gastrointestinal:  [] Blood in stool   [] Vomiting blood  [] Gastroesophageal reflux/heartburn   [] Difficulty swallowing. Genitourinary:  [] Chronic kidney disease   [] Difficult urination  [] Frequent urination  [] Burning with urination   [] Blood in urine Skin:  [] Rashes   [] Ulcers   [] Wounds Psychological:  [] History of anxiety   []  History of major depression.  Physical Examination  Vitals:   09/08/22 1314 09/08/22 1645  BP: (!) 176/73 (!) 182/49  Pulse: 64 62  Resp: 16 13  Temp: 98.2 F (36.8 C)   SpO2: 98% 94%   There is no height or weight on file to calculate BMI. Gen:  WD/WN, NAD Head: [place]/AT, No temporalis wasting. Prominent temp pulse not noted. Ear/Nose/Throat: Hearing grossly intact, nares w/o erythema or drainage, oropharynx w/o Erythema/Exudate Eyes: Sclera non-icteric, conjunctiva clear Neck: Trachea midline.  No JVD.  Pulmonary:  Good air movement, respirations not labored, equal bilaterally.  Cardiac: RRR, normal S1, S2. Vascular: Bilateral carotid bruits.  Normal right groin access site with no significant bruising. Vessel Right Left  Radial Palpable Palpable  Ulnar Palpable Palpable  Brachial Palpable Palpable  Carotid Palpable,  without bruit Palpable, without bruit  Aorta Not palpable N/A  Femoral Palpable Palpable  Popliteal Palpable Palpable  PT Palpable Palpable  DP Palpable Palpable   Gastrointestinal: soft, non-tender/non-distended. No guarding/reflex.  Musculoskeletal: M/S 5/5 throughout.  Extremities without ischemic changes.  No deformity or atrophy. No edema. Neurologic: Sensation grossly intact in extremities.  Symmetrical.  Speech is fluent. Motor exam as listed above. Psychiatric: Judgment intact, Mood & affect appropriate for pt's clinical situation. Dermatologic: No rashes or ulcers noted.  No cellulitis or open wounds. Lymph : No Cervical, Axillary, or Inguinal lymphadenopathy.     CBC Lab Results  Component Value Date   WBC 9.7 09/08/2022   HGB 12.6 09/08/2022   HCT 39.8 09/08/2022   MCV 91.5 09/08/2022   PLT 328 09/08/2022    BMET    Component Value Date/Time   NA 132 (L) 09/08/2022 1313   NA 136 01/27/2013 1123   K 5.0 09/08/2022 1313   K 4.0 01/27/2013 1123   CL 94 (L) 09/08/2022 1313   CL 100 01/27/2013 1123   CO2 26 09/08/2022 1313   CO2 30 01/27/2013 1123   GLUCOSE 107 (H) 09/08/2022 1313   GLUCOSE 150 (H) 01/27/2013 1123   BUN 30 (H) 09/08/2022 1313   BUN 13 01/27/2013 1123   CREATININE 2.03 (H) 09/08/2022 1313   CREATININE 1.05 01/27/2013 1123   CALCIUM 9.2 09/08/2022 1313   CALCIUM 9.3 01/27/2013 1123   GFRNONAA 25 (L) 09/08/2022 1313   GFRNONAA 55 (L) 01/27/2013 1123   GFRAA >60  01/27/2013 1123   Estimated Creatinine Clearance: 23.1 mL/min (A) (by C-G formula based on SCr of 2.03 mg/dL (H)).  COAG Lab Results  Component Value Date   INR 1.0 09/08/2022    Radiology MR BRAIN WO CONTRAST  Result Date: 09/08/2022 CLINICAL DATA:  Neuro deficit, acute, stroke suspected; Stroke/TIA, determine embolic source EXAM: MRI HEAD WITHOUT CONTRAST MRA HEAD WITHOUT CONTRAST MRA NECK WITHOUT CONTRAST TECHNIQUE: Multiplanar, multiecho pulse sequences of the brain and  surrounding structures were obtained without intravenous contrast. Angiographic images of the Circle of Willis were obtained using MRA technique without intravenous contrast. Angiographic images of the neck were obtained using MRA technique without intravenous contrast. Carotid stenosis measurements (when applicable) are obtained utilizing NASCET criteria, using the distal internal carotid diameter as the denominator. COMPARISON:  Same day ultrasound. FINDINGS: MRI HEAD FINDINGS Brain: No acute infarction, hemorrhage, hydrocephalus, extra-axial collection or mass lesion. Vascular: See below. Skull and upper cervical spine: Normal marrow signal. Sinuses/Orbits: Clear sinuses.  No acute orbital findings. Other: No mastoid effusions. MRA HEAD FINDINGS Anterior circulation: Hypoplastic right A1 ACA, likely congenital. Otherwise, bilateral intracranial ICAs, MCAs and ACAs are patent without proximal high-grade stenosis. Posterior circulation: Bilateral intradural vertebral arteries, basilar artery and bilateral posterior cerebral arteries are patent without proximal hemodynamically significant stenosis. Bilateral posterior communicating arteries are present. MRA NECK FINDINGS Right carotid: Likely severe narrowing of the right ICA origin. Left carotid: No flow related signal within the distal left common carotid artery and proximal internal carotid artery, which likely is due to stent. A follow-up CTA or catheter arteriogram could better assess stent patency if the patient is able. More distal ICA in the upper neck demonstrates normal flow related signal. Vertebral arteries: Patent bilaterally. No evidence of significant stenosis; however, the proximal vertebral arteries are not well evaluated. IMPRESSION: MRI head: No evidence of acute intracranial abnormality. MRA neck: 1. Likely severe narrowing of the right ICA origin. A CTA could better quantify if clinically warranted. 2. No flow related signal within the distal  left common carotid artery and proximal internal carotid artery, which likely is due to stent. A follow-up CTA or catheter arteriogram could better assess stent patency if the patient is able. More distal ICA in the upper neck demonstrates normal flow related signal. MRA head: No large vessel occlusion or proximal hemodynamically significant stenosis. Electronically Signed   By: Feliberto Harts M.D.   On: 09/08/2022 17:51   MR ANGIO HEAD WO CONTRAST  Result Date: 09/08/2022 CLINICAL DATA:  Neuro deficit, acute, stroke suspected; Stroke/TIA, determine embolic source EXAM: MRI HEAD WITHOUT CONTRAST MRA HEAD WITHOUT CONTRAST MRA NECK WITHOUT CONTRAST TECHNIQUE: Multiplanar, multiecho pulse sequences of the brain and surrounding structures were obtained without intravenous contrast. Angiographic images of the Circle of Willis were obtained using MRA technique without intravenous contrast. Angiographic images of the neck were obtained using MRA technique without intravenous contrast. Carotid stenosis measurements (when applicable) are obtained utilizing NASCET criteria, using the distal internal carotid diameter as the denominator. COMPARISON:  Same day ultrasound. FINDINGS: MRI HEAD FINDINGS Brain: No acute infarction, hemorrhage, hydrocephalus, extra-axial collection or mass lesion. Vascular: See below. Skull and upper cervical spine: Normal marrow signal. Sinuses/Orbits: Clear sinuses.  No acute orbital findings. Other: No mastoid effusions. MRA HEAD FINDINGS Anterior circulation: Hypoplastic right A1 ACA, likely congenital. Otherwise, bilateral intracranial ICAs, MCAs and ACAs are patent without proximal high-grade stenosis. Posterior circulation: Bilateral intradural vertebral arteries, basilar artery and bilateral posterior cerebral arteries are patent without proximal hemodynamically significant  stenosis. Bilateral posterior communicating arteries are present. MRA NECK FINDINGS Right carotid: Likely severe  narrowing of the right ICA origin. Left carotid: No flow related signal within the distal left common carotid artery and proximal internal carotid artery, which likely is due to stent. A follow-up CTA or catheter arteriogram could better assess stent patency if the patient is able. More distal ICA in the upper neck demonstrates normal flow related signal. Vertebral arteries: Patent bilaterally. No evidence of significant stenosis; however, the proximal vertebral arteries are not well evaluated. IMPRESSION: MRI head: No evidence of acute intracranial abnormality. MRA neck: 1. Likely severe narrowing of the right ICA origin. A CTA could better quantify if clinically warranted. 2. No flow related signal within the distal left common carotid artery and proximal internal carotid artery, which likely is due to stent. A follow-up CTA or catheter arteriogram could better assess stent patency if the patient is able. More distal ICA in the upper neck demonstrates normal flow related signal. MRA head: No large vessel occlusion or proximal hemodynamically significant stenosis. Electronically Signed   By: Feliberto Harts M.D.   On: 09/08/2022 17:51   MR ANGIO NECK WO CONTRAST  Result Date: 09/08/2022 CLINICAL DATA:  Neuro deficit, acute, stroke suspected; Stroke/TIA, determine embolic source EXAM: MRI HEAD WITHOUT CONTRAST MRA HEAD WITHOUT CONTRAST MRA NECK WITHOUT CONTRAST TECHNIQUE: Multiplanar, multiecho pulse sequences of the brain and surrounding structures were obtained without intravenous contrast. Angiographic images of the Circle of Willis were obtained using MRA technique without intravenous contrast. Angiographic images of the neck were obtained using MRA technique without intravenous contrast. Carotid stenosis measurements (when applicable) are obtained utilizing NASCET criteria, using the distal internal carotid diameter as the denominator. COMPARISON:  Same day ultrasound. FINDINGS: MRI HEAD FINDINGS Brain:  No acute infarction, hemorrhage, hydrocephalus, extra-axial collection or mass lesion. Vascular: See below. Skull and upper cervical spine: Normal marrow signal. Sinuses/Orbits: Clear sinuses.  No acute orbital findings. Other: No mastoid effusions. MRA HEAD FINDINGS Anterior circulation: Hypoplastic right A1 ACA, likely congenital. Otherwise, bilateral intracranial ICAs, MCAs and ACAs are patent without proximal high-grade stenosis. Posterior circulation: Bilateral intradural vertebral arteries, basilar artery and bilateral posterior cerebral arteries are patent without proximal hemodynamically significant stenosis. Bilateral posterior communicating arteries are present. MRA NECK FINDINGS Right carotid: Likely severe narrowing of the right ICA origin. Left carotid: No flow related signal within the distal left common carotid artery and proximal internal carotid artery, which likely is due to stent. A follow-up CTA or catheter arteriogram could better assess stent patency if the patient is able. More distal ICA in the upper neck demonstrates normal flow related signal. Vertebral arteries: Patent bilaterally. No evidence of significant stenosis; however, the proximal vertebral arteries are not well evaluated. IMPRESSION: MRI head: No evidence of acute intracranial abnormality. MRA neck: 1. Likely severe narrowing of the right ICA origin. A CTA could better quantify if clinically warranted. 2. No flow related signal within the distal left common carotid artery and proximal internal carotid artery, which likely is due to stent. A follow-up CTA or catheter arteriogram could better assess stent patency if the patient is able. More distal ICA in the upper neck demonstrates normal flow related signal. MRA head: No large vessel occlusion or proximal hemodynamically significant stenosis. Electronically Signed   By: Feliberto Harts M.D.   On: 09/08/2022 17:51   US Carotid Duplex Bilateral  Result Date:  09/08/2022 CLINICAL DATA:  Blurry vision EXAM: BILATERAL CAROTID DUPLEX ULTRASOUND TECHNIQUE: Wallace Cullens scale imaging,  color Doppler and duplex ultrasound were performed of bilateral carotid and vertebral arteries in the neck. COMPARISON:  Report from carotid ultrasound from [facility] on 03/08/2021. No images are available FINDINGS: Criteria: Quantification of carotid stenosis is based on velocity parameters that correlate the residual internal carotid diameter with NASCET-based stenosis levels, using the diameter of the distal internal carotid lumen as the denominator for stenosis measurement. The following velocity measurements were obtained: RIGHT ICA: 241.5 /35.7 cm/sec CCA: 71.2 / 10.7 cm/sec SYSTOLIC ICA/CCA RATIO:  3.9 ECA: 179.2 cm/sec LEFT ICA: 199.7/33.1 cm/sec CCA: 205.0/44.2 cm/sec SYSTOLIC ICA/CCA RATIO:  1.0 ECA: 219.7 cm/sec RIGHT CAROTID ARTERY: Large atherosclerotic plaque in the carotid bulb causes severe narrowing (70-99%). RIGHT VERTEBRAL ARTERY:  Antegrade. LEFT CAROTID ARTERY: A stent has been placed across the carotid bulb on the left (performed 08/27/2022). In stent velocities reach 200 cm/s. However velocity based stenosis criteria applicable to native carotid arteries do not apply to carotid artery stents. LEFT VERTEBRAL ARTERY:  Antegrade. Other: Heterogenous avascular lesion seen near the left subclavian artery/vein near the clavicle measuring 3.7 x 2.7 x 2.3 cm, likely a small hematoma given recent procedure. IMPRESSION: 1. Large atherosclerotic plaque in the right carotid bulb causes severe narrowing (70-99%). 2. A stent has been placed across the left carotid bulb (performed 08/27/2022). In stent velocities reach 200 cm/s. Velocity based stenosis criteria applicable to native carotid arteries do not apply to carotid artery stents. This exam can serve as a baseline for comparison. 3. Small hematoma without vascular flow near the left subclavian artery/vein Electronically Signed   By: Minerva Fester M.D.   On: 09/08/2022 17:33   CT HEAD WO CONTRAST  Result Date: 09/08/2022 CLINICAL DATA:  Transient ischemic attack. Blurry vision for 3-4 days. Started on Plavix last week after an arterial stent was placed. Headache starting today. EXAM: CT HEAD WITHOUT CONTRAST TECHNIQUE: Contiguous axial images were obtained from the base of the skull through the vertex without intravenous contrast. RADIATION DOSE REDUCTION: This exam was performed according to the departmental dose-optimization program which includes automated exposure control, adjustment of the mA and/or kV according to patient size and/or use of iterative reconstruction technique. COMPARISON:  CT brain 04/28/2010 FINDINGS: Brain: There is mild cortical atrophy, increased from remote prior 04/28/2010 CT but within normal limits for patient age. The ventricles are normal in configuration. The basilar cisterns are patent. No mass, mass effect, or midline shift. No acute intracranial hemorrhage is seen. No abnormal extra-axial fluid collection. Mild periventricular white matter patchy hypodensities, nonspecific but most likely secondary to chronic ischemic white matter changes. Otherwise, preservation of the normal cortical [place]-white interface without CT evidence of an acute major vascular territorial cortical based infarction. Vascular: No hyperdense vessel or unexpected calcification. Skull: Normal. Negative for fracture or focal lesion. Sinuses/Orbits: Status post bilateral ocular lens replacements. The visualized paranasal sinuses and mastoid air cells are clear. Other: None. IMPRESSION: 1. No acute intracranial process. 2. Mild cortical atrophy and chronic ischemic white matter changes, within normal limits for patient age. Electronically Signed   By: Neita Garnet M.D.   On: 09/08/2022 14:15   PERIPHERAL VASCULAR CATHETERIZATION  Result Date: 08/27/2022 See surgical note for result.     Assessment/Plan 1. I have reviewed her imaging  including the report as well as pictures.  I wanted a CTA but she has significant CRI so we settled on carotid Duplex and head and vascular MRI.  I also reviewed her completion images from the procedure.  I  discussed this all with her and her family and the ER Attending.  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  Velocity of up to 150 to 170 is normal in stents and hers was 200.  No sign of stroke on brain MRI.  Stent artifact noted on neck MRI as expected.  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  Nothing explains these symptoms from a vascular cause.  I suspect his is from her diabetic eye disease.  I asked her to f/u with her Ophthalmologist this week to be checked.  I want her to continue Plavix and ASA and f/u as directed with Dr. Wyn Quaker.  I encouraged her to call back overt the weekend if any new or worse symptoms arise.  They are pleased with the plan.   Iline Oven, MD  09/08/2022 6:52 PM    This note was created with Dragon medical transcription system.  Any error is purely unintentional

## 2022-09-08 NOTE — Discharge Instructions (Signed)
Follow-up with the eye doctor as instructed.  You taking her Plavix.  Return to the ER immediately for new, worsening, persistent severe vision changes, severe headache, stroke like symptoms, or any other new or worsening symptoms that concern you.

## 2022-09-08 NOTE — ED Triage Notes (Addendum)
Pt to ED via POV from home. Pt reports blurry vision x3-4days. Pt reports still having blurry vision and it has been constant. Pt reports was placed on Plavix last week after arterial stent was placed. Pt denies weakness, numbness, speech changes. Pt also reports HA that started today.

## 2022-09-08 NOTE — ED Provider Notes (Signed)
[facility] Provider Note    Event Date/Time   First MD Initiated Contact with Patient 09/08/22 1322     (approximate)   History   Blurred Vision   HPI  Jenna Olson is a 77 y.o. female presents to the ER for evaluation of blurry vision over the past few days.  She status post recent carotid endarterectomy discharged on Plavix.  Has not had any other associated numbness or tingling.  States that she just feels like she has to struggle to focus on writing on the wall.  This is new for her.  She denies any pain no trouble swallowing.  Call vascular surgery, Dr. Wallace Cullens notified me that this patient would be coming to the ER and did recommend radiographic evaluation of carotids given recent procedure.     Physical Exam   Triage Vital Signs: ED Triage Vitals  Encounter Vitals Group     BP 09/08/22 1314 (!) 176/73     Systolic BP Percentile --      Diastolic BP Percentile --      Pulse Rate 09/08/22 1314 64     Resp 09/08/22 1314 16     Temp 09/08/22 1314 98.2 F (36.8 C)     Temp src --      SpO2 09/08/22 1314 98 %     Weight --      Height --      Head Circumference --      Peak Flow --      Pain Score 09/08/22 1310 5     Pain Loc --      Pain Education --      Exclude from Growth Chart --     Most recent vital signs: Vitals:   09/08/22 1314  BP: (!) 176/73  Pulse: 64  Resp: 16  Temp: 98.2 F (36.8 C)  SpO2: 98%     Constitutional: Alert  Eyes: Conjunctivae are normal.  No sign of vitreous hemorrhage. perrl Head: Atraumatic. Nose: No congestion/rhinnorhea. Mouth/Throat: Mucous membranes are moist.   Neck: Painless ROM.  Cardiovascular:   Good peripheral circulation. Respiratory: Normal respiratory effort.  No retractions.  Gastrointestinal: Soft and nontender.  Musculoskeletal:  no deformity Neurologic:  MAE spontaneously. No gross focal neurologic deficits are appreciated.  Skin:  Skin is warm, dry and intact. No rash  noted. Psychiatric: Mood and affect are normal. Speech and behavior are normal.    ED Results / Procedures / Treatments   Labs (all labs ordered are listed, but only abnormal results are displayed) Labs Reviewed  COMPREHENSIVE METABOLIC PANEL - Abnormal; Notable for the following components:      Result Value   Sodium 132 (*)    Chloride 94 (*)    Glucose, Bld 107 (*)    BUN 30 (*)    Creatinine, Ser 2.03 (*)    AST 13 (*)    GFR, Estimated 25 (*)    All other components within normal limits  DIFFERENTIAL - Abnormal; Notable for the following components:   Abs Immature Granulocytes 0.11 (*)    All other components within normal limits  CBC  PROTIME-INR  APTT  ETHANOL     EKG  ED ECG REPORT I, Willy Eddy, the attending physician, personally viewed and interpreted this ECG.   Date: 09/08/2022  EKG Time: 13:12  Rate: 60  Rhythm: sinus  Axis: normal  Intervals: normal  ST&T Change: no stemi, no depressions    RADIOLOGY Please see  ED Course for my review and interpretation.  I personally reviewed all radiographic images ordered to evaluate for the above acute complaints and reviewed radiology reports and findings.  These findings were personally discussed with the patient.  Please see medical record for radiology report.    PROCEDURES:  Critical Care performed: No  Procedures   MEDICATIONS ORDERED IN ED: Medications  sodium chloride flush (NS) 0.9 % injection 3 mL ( Intravenous Canceled Entry 09/08/22 1315)  sodium chloride 0.9 % bolus 1,000 mL (0 mLs Intravenous Stopped 09/08/22 1500)     IMPRESSION / MDM / ASSESSMENT AND PLAN / ED COURSE  I reviewed the triage vital signs and the nursing notes.                              Differential diagnosis includes, but is not limited to, thrombosis, TIA, CVA, mass, bleed, vitreous hemorrhage, migraine, medication effect, dehydration  Patient presenting to the ER for evaluation of symptoms as described  above.  Based on symptoms, risk factors and considered above differential, this presenting complaint could reflect a potentially life-threatening illness therefore the patient will be placed on continuous pulse oximetry and telemetry for monitoring.  Laboratory evaluation will be sent to evaluate for the above complaints.  I plan to order CTA but patient with reduced GFR currently, discussed case in consultation with radiology who recommends carotid duplex as well as MRI/MRA for further evaluation.  I clinically have low suspicion for dissection.  Her symptoms seem less consistent with acute CVA but still within the differential which will require imaging which I have ordered.    Clinical Course as of 09/08/22 1515  Sat Sep 08, 2022  1355 CT head on my review and interpretation without evidence of bleed. [PR]    Clinical Course User Index [PR] Willy Eddy, MD   Patient will be signed out to oncoming physician pending follow-up imaging.  Hemodynamically stable   FINAL CLINICAL IMPRESSION(S) / ED DIAGNOSES   Final diagnoses:  Blurred vision     Rx / DC Orders   ED Discharge Orders     None        Note:  This document was prepared using Dragon voice recognition software and may include unintentional dictation errors.    Willy Eddy, MD 09/08/22 1515

## 2022-09-08 NOTE — ED Provider Notes (Signed)
-----------------------------------------   6:51 PM on 09/08/2022 -----------------------------------------  I took over care of this patient from Dr. Roxan Hockey.  MRI of the brain shows no acute findings.  MRA of the neck as well as ultrasound do show some vascular findings.  I consulted and discussed the case with Dr. Wallace Cullens from vascular surgery who reviewed the imaging and came to evaluate the patient in the ED.  He advises that there are no acute vascular findings that require further workup or intervention in the hospital currently.  He agrees with the plan for outpatient ophthalmology follow-up.  He has counseled the patient on the plan.  I checked in with her as well.  She is stable for discharge at this time.  Return precautions have been provided, and she expresses understanding.   Dionne Bucy, MD 09/08/22 [phone]

## 2022-09-13 DIAGNOSIS — J449 Chronic obstructive pulmonary disease, unspecified: Secondary | ICD-10-CM | POA: Diagnosis not present

## 2022-09-13 DIAGNOSIS — E1122 Type 2 diabetes mellitus with diabetic chronic kidney disease: Secondary | ICD-10-CM | POA: Diagnosis not present

## 2022-09-13 DIAGNOSIS — I10 Essential (primary) hypertension: Secondary | ICD-10-CM | POA: Diagnosis not present

## 2022-09-13 DIAGNOSIS — N1832 Chronic kidney disease, stage 3b: Secondary | ICD-10-CM | POA: Diagnosis not present

## 2022-09-13 DIAGNOSIS — R809 Proteinuria, unspecified: Secondary | ICD-10-CM | POA: Diagnosis not present

## 2022-10-01 ENCOUNTER — Other Ambulatory Visit (INDEPENDENT_AMBULATORY_CARE_PROVIDER_SITE_OTHER): Payer: Self-pay | Admitting: Vascular Surgery

## 2022-10-01 DIAGNOSIS — I6523 Occlusion and stenosis of bilateral carotid arteries: Secondary | ICD-10-CM

## 2022-10-03 ENCOUNTER — Encounter (INDEPENDENT_AMBULATORY_CARE_PROVIDER_SITE_OTHER): Payer: Self-pay | Admitting: Nurse Practitioner

## 2022-10-03 ENCOUNTER — Ambulatory Visit (INDEPENDENT_AMBULATORY_CARE_PROVIDER_SITE_OTHER): Payer: Medicare PPO

## 2022-10-03 ENCOUNTER — Ambulatory Visit (INDEPENDENT_AMBULATORY_CARE_PROVIDER_SITE_OTHER): Payer: Medicare PPO | Admitting: Nurse Practitioner

## 2022-10-03 VITALS — BP 163/77 | HR 67 | Resp 18 | Ht 62.0 in | Wt 186.0 lb

## 2022-10-03 DIAGNOSIS — I10 Essential (primary) hypertension: Secondary | ICD-10-CM

## 2022-10-03 DIAGNOSIS — I6523 Occlusion and stenosis of bilateral carotid arteries: Secondary | ICD-10-CM

## 2022-10-03 DIAGNOSIS — E1140 Type 2 diabetes mellitus with diabetic neuropathy, unspecified: Secondary | ICD-10-CM

## 2022-10-03 DIAGNOSIS — I739 Peripheral vascular disease, unspecified: Secondary | ICD-10-CM

## 2022-10-03 NOTE — Progress Notes (Signed)
Subjective:    Patient ID: Jenna Olson, female    DOB: 27-Oct-1945, 77 y.o.   MRN: [id number] Chief Complaint  Patient presents with   Follow-up    Follow up in 1 month (09/28/2022); Carotid ultrasound. Bilateral    The patient is seen for follow up evaluation of carotid stenosis status post left carotid stent on 09/08/2022.  There were no post operative problems or complications related to the surgery.  The patient denies neck or incisional pain.  She presented to the emergency room shortly after the surgery with concern for some blurry vision but the vascular surgery on-call ruled out any vascular involvement or relation to the carotid stent placement. Prior to intervention the patient was having episodes of dizziness but this has improved postintervention.  The patient denies interval amaurosis fugax. There is no recent history of TIA symptoms or focal motor deficits. There is no prior documented CVA.  The patient denies headache.  The patient is taking enteric-coated aspirin 81 mg daily.  No recent shortening of the patient's walking distance or new symptoms consistent with claudication.  No history of rest pain symptoms. No new ulcers or wounds of the lower extremities have occurred.  There is no history of DVT, PE or superficial thrombophlebitis. No recent episodes of angina or shortness of breath documented.    Today the patient has a 40 to 59% stenosis in the bilateral internal carotid arteries.  The left stent is patent     Review of Systems  Eyes:  Negative for visual disturbance.  Neurological:  Negative for dizziness.  All other systems reviewed and are negative.      Objective:   Physical Exam Vitals reviewed.  HENT:     Head: Normocephalic.  Neck:     Vascular: No carotid bruit.  Cardiovascular:     Rate and Rhythm: Normal rate.     Pulses: Normal pulses.  Pulmonary:     Effort: Pulmonary effort is normal.  Skin:    General: Skin is warm and dry.   Neurological:     Mental Status: She is alert and oriented to person, place, and time.  Psychiatric:        Mood and Affect: Mood normal.        Behavior: Behavior normal.        Thought Content: Thought content normal.        Judgment: Judgment normal.     BP (!) 163/77 (BP Location: Right Arm)   Pulse 67   Resp 18   Ht 5\' 2"  (1.575 m)   Wt 186 lb (84.4 kg)   BMI 34.02 kg/m   Past Medical History:  Diagnosis Date   Cancer (HCC)    skin   Chronic kidney disease    COPD (chronic obstructive pulmonary disease) (HCC)    Diabetes mellitus without complication (HCC)    Dyspnea    Dysuria    GERD (gastroesophageal reflux disease)    High cholesterol    Hypertension    Incontinence    Proteinuria    Sleep apnea    Urinary frequency    Urinary urgency    UTI (lower urinary tract infection)     Social History   Socioeconomic History   Marital status: Widowed    Spouse name: Not on file   Number of children: Not on file   Years of education: Not on file   Highest education level: Not on file  Occupational History   Not on  file  Tobacco Use   Smoking status: Former   Smokeless tobacco: Never   Tobacco comments:    quit 15 years  Vaping Use   Vaping status: Never Used  Substance and Sexual Activity   Alcohol use: No    Alcohol/week: 0.0 standard drinks of alcohol   Drug use: No   Sexual activity: Not on file  Other Topics Concern   Not on file  Social History Narrative   Not on file   Social Determinants of Health   Financial Resource Strain: Not on file  Food Insecurity: No Food Insecurity (08/27/2022)   Hunger Vital Sign    Worried About Running Out of Food in the Last Year: Never true    Ran Out of Food in the Last Year: Never true  Transportation Needs: No Transportation Needs (08/27/2022)   PRAPARE - Administrator, Civil Service (Medical): No    Lack of Transportation (Non-Medical): No  Physical Activity: Not on file  Stress: Not on  file  Social Connections: Not on file  Intimate Partner Violence: Not on file    Past Surgical History:  Procedure Laterality Date   ABDOMINAL HYSTERECTOMY     BREAST BIOPSY Left    neg   CAROTID ANGIOGRAPHY Left 08/27/2022   Procedure: CAROTID ANGIOGRAPHY;  Surgeon: Annice Needy, MD;  Location: [facility] INVASIVE CV LAB;  Service: Cardiovascular;  Laterality: Left;   CHOLECYSTECTOMY     COLONOSCOPY WITH PROPOFOL N/A 02/18/2018   Procedure: COLONOSCOPY WITH PROPOFOL;  Surgeon: Christena Deem, MD;  Location: [facility] ENDOSCOPY;  Service: Endoscopy;  Laterality: N/A;   MEDIAL PARTIAL KNEE REPLACEMENT Right    PARTIAL HYSTERECTOMY     RIGHT HEART CATH N/A 02/23/2021   Procedure: RIGHT HEART CATH;  Surgeon: Alwyn Pea, MD;  Location: [facility] INVASIVE CV LAB;  Service: Cardiovascular;  Laterality: N/A;   TONSILLECTOMY      Family History  Problem Relation Age of Onset   Cancer Grandchild        ewing sarcoma   Bladder Cancer Maternal Uncle    Prostate cancer Maternal Uncle    Kidney disease Neg Hx    Breast cancer Neg Hx     Allergies  Allergen Reactions   Atorvastatin Other (See Comments)    Elevated lft's   Hydrochlorothiazide Other (See Comments)    Hyponatremia   Sulfa Antibiotics Other (See Comments)    Child hood allergy   Amlodipine Swelling   Amoxicillin Rash    Rash in her mouth.  Painful.       Latest Ref Rng & Units 09/08/2022    1:13 PM 08/28/2022    5:42 AM 01/14/2018    1:44 PM  CBC  WBC 4.0 - 10.5 K/uL 9.7  7.5  10.4   Hemoglobin 12.0 - 15.0 g/dL 52.8  41.3  24.4   Hematocrit 36.0 - 46.0 % 39.8  33.7  38.6   Platelets 150 - 400 K/uL 328  240  339       CMP     Component Value Date/Time   NA 132 (L) 09/08/2022 1313   NA 136 01/27/2013 1123   K 5.0 09/08/2022 1313   K 4.0 01/27/2013 1123   CL 94 (L) 09/08/2022 1313   CL 100 01/27/2013 1123   CO2 26 09/08/2022 1313   CO2 30 01/27/2013 1123   GLUCOSE 107 (H) 09/08/2022 1313   GLUCOSE 150 (H)  01/27/2013 1123   BUN 30 (H) 09/08/2022  1313   BUN 13 01/27/2013 1123   CREATININE 2.03 (H) 09/08/2022 1313   CREATININE 1.05 01/27/2013 1123   CALCIUM 9.2 09/08/2022 1313   CALCIUM 9.3 01/27/2013 1123   PROT 7.4 09/08/2022 1313   ALBUMIN 4.1 09/08/2022 1313   AST 13 (L) 09/08/2022 1313   ALT 11 09/08/2022 1313   ALKPHOS 70 09/08/2022 1313   BILITOT 0.7 09/08/2022 1313   GFRNONAA 25 (L) 09/08/2022 1313   GFRNONAA 55 (L) 01/27/2013 1123     No results found.     Assessment & Plan:   1. Bilateral carotid artery stenosis Recommend:  The patient is s/p successful left carotid stent  Duplex ultrasound  shows left stenosis.  Continue dual antiplatelet therapy as prescribed for a minimum of 6 months Continue management of CAD, HTN and Hyperlipidemia Healthy heart diet,  encouraged exercise at least 4 times per week  The patient's NIHSS score is as follows: 0 Mild: 1 - 5 Mild to Moderately Severe: 5 - 14 Severe: 15 - 24 Very Severe: >25  Follow up in 6 months with duplex ultrasound and physical exam based on the patient's carotid surgery   2. Benign essential hypertension Continue antihypertensive medications as already ordered, these medications have been reviewed and there are no changes at this time.  3. Type 2 diabetes, controlled, with neuropathy (HCC) Continue hypoglycemic medications as already ordered, these medications have been reviewed and there are no changes at this time.  Hgb A1C to be monitored as already arranged by primary service  4. PAD (peripheral artery disease) (HCC) The patient does have a history of peripheral arterial disease with iliac level stents placed in 2015.  It appears it has been sometime since she has had these evaluated.  We will discussed with patient at upcoming visit regarding studies.   Current Outpatient Medications on File Prior to Visit  Medication Sig Dispense Refill   acetaminophen (TYLENOL) 500 MG tablet Take 500 mg by  mouth every 6 (six) hours as needed for mild pain.     aspirin EC 81 MG tablet Take 81 mg by mouth daily.     azelastine (ASTELIN) 0.1 % nasal spray Place 1 spray into both nostrils daily.     b complex vitamins capsule Take 1 capsule by mouth daily.     carboxymethylcellulose (REFRESH PLUS) 0.5 % SOLN Place 1 drop into both eyes daily as needed (dry eyes).     Cholecalciferol 25 MCG (1000 UT) capsule Take 1,000 Units by mouth daily.     clopidogrel (PLAVIX) 75 MG tablet Take 1 tablet (75 mg total) by mouth daily at 6 (six) AM. 30 tablet 11   esomeprazole (NEXIUM) 40 MG capsule Take 40 mg by mouth daily at 12 noon.     furosemide (LASIX) 20 MG tablet Take 20 mg by mouth daily.     gabapentin (NEURONTIN) 300 MG capsule Take 300 mg by mouth at bedtime.     hydrALAZINE (APRESOLINE) 100 MG tablet Take 100 mg by mouth 2 (two) times daily.     irbesartan (AVAPRO) 300 MG tablet Take 300 mg by mouth daily.     labetalol (NORMODYNE) 200 MG tablet Take 200 mg by mouth 2 (two) times daily.     loratadine (CLARITIN) 10 MG tablet Take 10 mg by mouth daily.     magnesium oxide (MAG-OX) 400 MG tablet Take 400 mg by mouth 2 (two) times daily.     methenamine (HIPREX) 1 g tablet Take 1 g  by mouth 2 (two) times daily.     omega-3 fish oil (MAXEPA) 1000 MG CAPS capsule Take 1 capsule by mouth daily.     OZEMPIC, 2 MG/DOSE, 8 MG/3ML SOPN Inject 2 mg into the skin once a week.     polycarbophil (FIBERCON) 625 MG tablet Take 625 mg by mouth daily.     pravastatin (PRAVACHOL) 40 MG tablet Take 40 mg by mouth daily.     pregabalin (LYRICA) 100 MG capsule Take 100-200 mg by mouth See admin instructions. Take 100 mg in the AM and afternoon  and 2 capsules (200 mg) at bedtime     rOPINIRole (REQUIP) 1 MG tablet Take 1 mg by mouth at bedtime.     solifenacin (VESICARE) 5 MG tablet Take 1 tablet (5 mg total) by mouth daily. 90 tablet 4   traZODone (DESYREL) 50 MG tablet Take 50 mg by mouth at bedtime.     No current  facility-administered medications on file prior to visit.    There are no Patient Instructions on file for this visit. No follow-ups on file.   Georgiana Spinner, NP

## 2022-10-11 DIAGNOSIS — E1122 Type 2 diabetes mellitus with diabetic chronic kidney disease: Secondary | ICD-10-CM | POA: Diagnosis not present

## 2022-10-11 DIAGNOSIS — M1990 Unspecified osteoarthritis, unspecified site: Secondary | ICD-10-CM | POA: Diagnosis not present

## 2022-10-11 DIAGNOSIS — E669 Obesity, unspecified: Secondary | ICD-10-CM | POA: Diagnosis not present

## 2022-10-11 DIAGNOSIS — Z823 Family history of stroke: Secondary | ICD-10-CM | POA: Diagnosis not present

## 2022-10-11 DIAGNOSIS — I129 Hypertensive chronic kidney disease with stage 1 through stage 4 chronic kidney disease, or unspecified chronic kidney disease: Secondary | ICD-10-CM | POA: Diagnosis not present

## 2022-10-11 DIAGNOSIS — Z87891 Personal history of nicotine dependence: Secondary | ICD-10-CM | POA: Diagnosis not present

## 2022-10-11 DIAGNOSIS — I272 Pulmonary hypertension, unspecified: Secondary | ICD-10-CM | POA: Diagnosis not present

## 2022-10-11 DIAGNOSIS — K219 Gastro-esophageal reflux disease without esophagitis: Secondary | ICD-10-CM | POA: Diagnosis not present

## 2022-10-11 DIAGNOSIS — J301 Allergic rhinitis due to pollen: Secondary | ICD-10-CM | POA: Diagnosis not present

## 2022-10-11 DIAGNOSIS — E785 Hyperlipidemia, unspecified: Secondary | ICD-10-CM | POA: Diagnosis not present

## 2022-10-11 DIAGNOSIS — G4733 Obstructive sleep apnea (adult) (pediatric): Secondary | ICD-10-CM | POA: Diagnosis not present

## 2022-10-11 DIAGNOSIS — G2581 Restless legs syndrome: Secondary | ICD-10-CM | POA: Diagnosis not present

## 2022-10-11 DIAGNOSIS — Z8249 Family history of ischemic heart disease and other diseases of the circulatory system: Secondary | ICD-10-CM | POA: Diagnosis not present

## 2022-10-11 DIAGNOSIS — Z809 Family history of malignant neoplasm, unspecified: Secondary | ICD-10-CM | POA: Diagnosis not present

## 2022-10-11 DIAGNOSIS — R011 Cardiac murmur, unspecified: Secondary | ICD-10-CM | POA: Diagnosis not present

## 2022-10-11 DIAGNOSIS — J439 Emphysema, unspecified: Secondary | ICD-10-CM | POA: Diagnosis not present

## 2022-10-11 DIAGNOSIS — K5900 Constipation, unspecified: Secondary | ICD-10-CM | POA: Diagnosis not present

## 2022-10-11 DIAGNOSIS — N3941 Urge incontinence: Secondary | ICD-10-CM | POA: Diagnosis not present

## 2022-10-13 DIAGNOSIS — J449 Chronic obstructive pulmonary disease, unspecified: Secondary | ICD-10-CM | POA: Diagnosis not present

## 2022-10-15 DIAGNOSIS — L02415 Cutaneous abscess of right lower limb: Secondary | ICD-10-CM | POA: Diagnosis not present

## 2022-10-15 DIAGNOSIS — L570 Actinic keratosis: Secondary | ICD-10-CM | POA: Diagnosis not present

## 2022-10-31 DIAGNOSIS — M79604 Pain in right leg: Secondary | ICD-10-CM | POA: Diagnosis not present

## 2022-10-31 DIAGNOSIS — R200 Anesthesia of skin: Secondary | ICD-10-CM | POA: Diagnosis not present

## 2022-10-31 DIAGNOSIS — I6523 Occlusion and stenosis of bilateral carotid arteries: Secondary | ICD-10-CM | POA: Diagnosis not present

## 2022-10-31 DIAGNOSIS — G2581 Restless legs syndrome: Secondary | ICD-10-CM | POA: Diagnosis not present

## 2022-10-31 DIAGNOSIS — R202 Paresthesia of skin: Secondary | ICD-10-CM | POA: Diagnosis not present

## 2022-10-31 DIAGNOSIS — G629 Polyneuropathy, unspecified: Secondary | ICD-10-CM | POA: Diagnosis not present

## 2022-10-31 DIAGNOSIS — M79605 Pain in left leg: Secondary | ICD-10-CM | POA: Diagnosis not present

## 2022-11-07 DIAGNOSIS — R058 Other specified cough: Secondary | ICD-10-CM | POA: Diagnosis not present

## 2022-11-12 ENCOUNTER — Telehealth (INDEPENDENT_AMBULATORY_CARE_PROVIDER_SITE_OTHER): Payer: Self-pay

## 2022-11-12 DIAGNOSIS — I288 Other diseases of pulmonary vessels: Secondary | ICD-10-CM | POA: Diagnosis not present

## 2022-11-12 DIAGNOSIS — G4733 Obstructive sleep apnea (adult) (pediatric): Secondary | ICD-10-CM | POA: Diagnosis not present

## 2022-11-12 DIAGNOSIS — J449 Chronic obstructive pulmonary disease, unspecified: Secondary | ICD-10-CM | POA: Diagnosis not present

## 2022-11-12 NOTE — Telephone Encounter (Signed)
Patient called stating that she had an Xray last week and the Dr. Catalina Pizza her, her stent was down in the middle of the stent. She said maybe it's supposed to be that way, but she would like for someone to look at her images and let her know what you think.   Please advise

## 2022-11-12 NOTE — Telephone Encounter (Signed)
X-rays are not very good imaging modality for stents.  It said that it was narrowed but not down.  A better way to look at her stent with an ultrasound which is what she had in September.  She also has an upcoming 1 again next month for [place] to reevaluate her stent.

## 2022-11-13 DIAGNOSIS — J449 Chronic obstructive pulmonary disease, unspecified: Secondary | ICD-10-CM | POA: Diagnosis not present

## 2022-11-13 NOTE — Telephone Encounter (Signed)
Spoke with patient.

## 2022-11-26 DIAGNOSIS — K219 Gastro-esophageal reflux disease without esophagitis: Secondary | ICD-10-CM | POA: Diagnosis not present

## 2022-11-26 DIAGNOSIS — K5909 Other constipation: Secondary | ICD-10-CM | POA: Diagnosis not present

## 2022-12-13 DIAGNOSIS — J449 Chronic obstructive pulmonary disease, unspecified: Secondary | ICD-10-CM | POA: Diagnosis not present

## 2022-12-24 DIAGNOSIS — J449 Chronic obstructive pulmonary disease, unspecified: Secondary | ICD-10-CM | POA: Diagnosis not present

## 2022-12-24 DIAGNOSIS — Z79899 Other long term (current) drug therapy: Secondary | ICD-10-CM | POA: Diagnosis not present

## 2022-12-24 DIAGNOSIS — D500 Iron deficiency anemia secondary to blood loss (chronic): Secondary | ICD-10-CM | POA: Diagnosis not present

## 2022-12-24 DIAGNOSIS — I272 Pulmonary hypertension, unspecified: Secondary | ICD-10-CM | POA: Diagnosis not present

## 2022-12-24 DIAGNOSIS — E559 Vitamin D deficiency, unspecified: Secondary | ICD-10-CM | POA: Diagnosis not present

## 2022-12-24 DIAGNOSIS — Z1331 Encounter for screening for depression: Secondary | ICD-10-CM | POA: Diagnosis not present

## 2022-12-24 DIAGNOSIS — E1169 Type 2 diabetes mellitus with other specified complication: Secondary | ICD-10-CM | POA: Diagnosis not present

## 2022-12-24 DIAGNOSIS — N390 Urinary tract infection, site not specified: Secondary | ICD-10-CM | POA: Diagnosis not present

## 2022-12-24 DIAGNOSIS — E1140 Type 2 diabetes mellitus with diabetic neuropathy, unspecified: Secondary | ICD-10-CM | POA: Diagnosis not present

## 2022-12-24 DIAGNOSIS — I1310 Hypertensive heart and chronic kidney disease without heart failure, with stage 1 through stage 4 chronic kidney disease, or unspecified chronic kidney disease: Secondary | ICD-10-CM | POA: Diagnosis not present

## 2022-12-24 DIAGNOSIS — Z1389 Encounter for screening for other disorder: Secondary | ICD-10-CM | POA: Diagnosis not present

## 2022-12-24 DIAGNOSIS — J301 Allergic rhinitis due to pollen: Secondary | ICD-10-CM | POA: Diagnosis not present

## 2022-12-24 DIAGNOSIS — G629 Polyneuropathy, unspecified: Secondary | ICD-10-CM | POA: Diagnosis not present

## 2022-12-24 DIAGNOSIS — I10 Essential (primary) hypertension: Secondary | ICD-10-CM | POA: Diagnosis not present

## 2022-12-24 DIAGNOSIS — E1122 Type 2 diabetes mellitus with diabetic chronic kidney disease: Secondary | ICD-10-CM | POA: Diagnosis not present

## 2022-12-24 DIAGNOSIS — Z0000 Encounter for general adult medical examination without abnormal findings: Secondary | ICD-10-CM | POA: Diagnosis not present

## 2023-01-04 ENCOUNTER — Other Ambulatory Visit (INDEPENDENT_AMBULATORY_CARE_PROVIDER_SITE_OTHER): Payer: Self-pay | Admitting: Nurse Practitioner

## 2023-01-04 DIAGNOSIS — I6523 Occlusion and stenosis of bilateral carotid arteries: Secondary | ICD-10-CM

## 2023-01-08 ENCOUNTER — Ambulatory Visit (INDEPENDENT_AMBULATORY_CARE_PROVIDER_SITE_OTHER): Payer: Medicare PPO | Admitting: Nurse Practitioner

## 2023-01-08 ENCOUNTER — Ambulatory Visit (INDEPENDENT_AMBULATORY_CARE_PROVIDER_SITE_OTHER): Payer: Medicare PPO

## 2023-01-08 VITALS — BP 151/64 | HR 65 | Resp 16 | Wt 186.0 lb

## 2023-01-08 DIAGNOSIS — I6523 Occlusion and stenosis of bilateral carotid arteries: Secondary | ICD-10-CM

## 2023-01-08 DIAGNOSIS — E1140 Type 2 diabetes mellitus with diabetic neuropathy, unspecified: Secondary | ICD-10-CM | POA: Diagnosis not present

## 2023-01-08 DIAGNOSIS — I10 Essential (primary) hypertension: Secondary | ICD-10-CM | POA: Diagnosis not present

## 2023-01-13 DIAGNOSIS — J449 Chronic obstructive pulmonary disease, unspecified: Secondary | ICD-10-CM | POA: Diagnosis not present

## 2023-01-14 DIAGNOSIS — R809 Proteinuria, unspecified: Secondary | ICD-10-CM | POA: Diagnosis not present

## 2023-01-14 DIAGNOSIS — N1832 Chronic kidney disease, stage 3b: Secondary | ICD-10-CM | POA: Diagnosis not present

## 2023-01-14 DIAGNOSIS — E1122 Type 2 diabetes mellitus with diabetic chronic kidney disease: Secondary | ICD-10-CM | POA: Diagnosis not present

## 2023-01-14 DIAGNOSIS — I10 Essential (primary) hypertension: Secondary | ICD-10-CM | POA: Diagnosis not present

## 2023-01-22 ENCOUNTER — Encounter (INDEPENDENT_AMBULATORY_CARE_PROVIDER_SITE_OTHER): Payer: Self-pay | Admitting: Nurse Practitioner

## 2023-01-22 NOTE — Progress Notes (Signed)
 Subjective:    Patient ID: Jenna Olson, female    DOB: November 30, 1945, 78 y.o.   MRN: [id number] Chief Complaint  Patient presents with   Follow-up    3 month carotid follow up    The patient is seen for follow up evaluation of carotid stenosis status post left carotid stent on 09/08/2022.  There were no post operative problems or complications related to the surgery.  The patient denies neck or incisional pain.  Jenna Olson presented to the emergency room shortly after the surgery with concern for some blurry vision but the vascular surgery on-call ruled out any vascular involvement or relation to the carotid stent placement. Prior to intervention the patient was having episodes of dizziness but this has improved postintervention.  The patient denies interval amaurosis fugax. There is no recent history of TIA symptoms or focal motor deficits. There is no prior documented CVA.  The patient denies headache.  The patient is taking enteric-coated aspirin  81 mg daily.  No recent shortening of the patient's walking distance or new symptoms consistent with claudication.  No history of rest pain symptoms. No new ulcers or wounds of the lower extremities have occurred.  There is no history of DVT, PE or superficial thrombophlebitis. No recent episodes of angina or shortness of breath documented.    Today the patient has a 40 to 59% stenosis in the bilateral internal carotid arteries.  The left stent is patent with improved velocities from previous studies     Review of Systems  Eyes:  Negative for visual disturbance.  Neurological:  Negative for dizziness.  All other systems reviewed and are negative.      Objective:   Physical Exam Vitals reviewed.  HENT:     Head: Normocephalic.  Neck:     Vascular: No carotid bruit.  Cardiovascular:     Rate and Rhythm: Normal rate.     Pulses: Normal pulses.  Pulmonary:     Effort: Pulmonary effort is normal.  Skin:    General: Skin is warm  and dry.  Neurological:     Mental Status: Jenna Olson is alert and oriented to person, place, and time.  Psychiatric:        Mood and Affect: Mood normal.        Behavior: Behavior normal.        Thought Content: Thought content normal.        Judgment: Judgment normal.     BP (!) 151/64 Comment: right arm  Pulse 65   Resp 16   Wt 186 lb (84.4 kg)   BMI 34.02 kg/m   Past Medical History:  Diagnosis Date   Cancer (HCC)    skin   Chronic kidney disease    COPD (chronic obstructive pulmonary disease) (HCC)    Diabetes mellitus without complication (HCC)    Dyspnea    Dysuria    GERD (gastroesophageal reflux disease)    High cholesterol    Hypertension    Incontinence    Proteinuria    Sleep apnea    Urinary frequency    Urinary urgency    UTI (lower urinary tract infection)     Social History   Socioeconomic History   Marital status: Widowed    Spouse name: Not on file   Number of children: Not on file   Years of education: Not on file   Highest education level: Not on file  Occupational History   Not on file  Tobacco Use   Smoking  status: Former   Smokeless tobacco: Never   Tobacco comments:    quit 15 years  Vaping Use   Vaping status: Never Used  Substance and Sexual Activity   Alcohol  use: No    Alcohol /week: 0.0 standard drinks of alcohol    Drug use: No   Sexual activity: Not on file  Other Topics Concern   Not on file  Social History Narrative   Not on file   Social Drivers of Health   Financial Resource Strain: Low Risk  (12/24/2022)   Received from [facility]   Overall Financial Resource Strain (CARDIA)    Difficulty of Paying Living Expenses: Not hard at all  Food Insecurity: No Food Insecurity (12/24/2022)   Received from [facility]   Hunger Vital Sign    Worried About Running Out of Food in the Last Year: Never true    Ran Out of Food in the Last Year: Never true  Transportation Needs: No Transportation  Needs (12/24/2022)   Received from [facility] - Transportation    In the past 12 months, has lack of transportation kept you from medical appointments or from getting medications?: No    Lack of Transportation (Non-Medical): No  Physical Activity: Not on file  Stress: Not on file  Social Connections: Not on file  Intimate Partner Violence: Not on file    Past Surgical History:  Procedure Laterality Date   ABDOMINAL HYSTERECTOMY     BREAST BIOPSY Left    neg   CAROTID ANGIOGRAPHY Left 08/27/2022   Procedure: CAROTID ANGIOGRAPHY;  Surgeon: Marea Selinda RAMAN, MD;  Location: [facility] INVASIVE CV LAB;  Service: Cardiovascular;  Laterality: Left;   CHOLECYSTECTOMY     COLONOSCOPY WITH PROPOFOL  N/A 02/18/2018   Procedure: COLONOSCOPY WITH PROPOFOL ;  Surgeon: Gaylyn Gladis PENNER, MD;  Location: [facility] ENDOSCOPY;  Service: Endoscopy;  Laterality: N/A;   MEDIAL PARTIAL KNEE REPLACEMENT Right    PARTIAL HYSTERECTOMY     RIGHT HEART CATH N/A 02/23/2021   Procedure: RIGHT HEART CATH;  Surgeon: Florencio Cara BIRCH, MD;  Location: [facility] INVASIVE CV LAB;  Service: Cardiovascular;  Laterality: N/A;   TONSILLECTOMY      Family History  Problem Relation Age of Onset   Cancer Grandchild        ewing sarcoma   Bladder Cancer Maternal Uncle    Prostate cancer Maternal Uncle    Kidney disease Neg Hx    Breast cancer Neg Hx     Allergies  Allergen Reactions   Atorvastatin Other (See Comments)    Elevated lft's   Hydrochlorothiazide Other (See Comments)    Hyponatremia   Sulfa Antibiotics Other (See Comments)    Child hood allergy   Amlodipine Swelling   Amoxicillin Rash    Rash in her mouth.  Painful.       Latest Ref Rng & Units 09/08/2022    1:13 PM 08/28/2022    5:42 AM 01/14/2018    1:44 PM  CBC  WBC 4.0 - 10.5 K/uL 9.7  7.5  10.4   Hemoglobin 12.0 - 15.0 g/dL 87.3  89.1  87.3   Hematocrit 36.0 - 46.0 % 39.8  33.7  38.6   Platelets 150 - 400 K/uL 328  240  339        CMP     Component Value Date/Time   NA 132 (L) 09/08/2022 1313   NA 136 01/27/2013 1123   K 5.0 09/08/2022 1313  K 4.0 01/27/2013 1123   CL 94 (L) 09/08/2022 1313   CL 100 01/27/2013 1123   CO2 26 09/08/2022 1313   CO2 30 01/27/2013 1123   GLUCOSE 107 (H) 09/08/2022 1313   GLUCOSE 150 (H) 01/27/2013 1123   BUN 30 (H) 09/08/2022 1313   BUN 13 01/27/2013 1123   CREATININE 2.03 (H) 09/08/2022 1313   CREATININE 1.05 01/27/2013 1123   CALCIUM  9.2 09/08/2022 1313   CALCIUM  9.3 01/27/2013 1123   PROT 7.4 09/08/2022 1313   ALBUMIN 4.1 09/08/2022 1313   AST 13 (L) 09/08/2022 1313   ALT 11 09/08/2022 1313   ALKPHOS 70 09/08/2022 1313   BILITOT 0.7 09/08/2022 1313   GFRNONAA 25 (L) 09/08/2022 1313   GFRNONAA 55 (L) 01/27/2013 1123     No results found.     Assessment & Plan:   1. Bilateral carotid artery stenosis Recommend:  Given the patient's asymptomatic subcritical stenosis no further invasive testing or surgery at this time.  Duplex ultrasound shows 40-59% stenosis bilaterally.  Continue antiplatelet therapy as prescribed Continue management of CAD, HTN and Hyperlipidemia Healthy heart diet,  encouraged exercise at least 4 times per week  Follow up in 6 months with duplex ultrasound and physical exam   2. Benign essential hypertension Continue antihypertensive medications as already ordered, these medications have been reviewed and there are no changes at this time.  3. Type 2 diabetes, controlled, with neuropathy (HCC) Continue hypoglycemic medications as already ordered, these medications have been reviewed and there are no changes at this time.  Hgb A1C to be monitored as already arranged by primary service     Current Outpatient Medications on File Prior to Visit  Medication Sig Dispense Refill   acetaminophen  (TYLENOL ) 500 MG tablet Take 500 mg by mouth every 6 (six) hours as needed for mild pain.     aspirin  EC 81 MG tablet Take 81 mg by mouth  daily.     azelastine  (ASTELIN ) 0.1 % nasal spray Place 1 spray into both nostrils daily.     b complex vitamins capsule Take 1 capsule by mouth daily.     carboxymethylcellulose (REFRESH PLUS) 0.5 % SOLN Place 1 drop into both eyes daily as needed (dry eyes).     Cholecalciferol  25 MCG (1000 UT) capsule Take 1,000 Units by mouth daily.     clopidogrel  (PLAVIX ) 75 MG tablet Take 1 tablet (75 mg total) by mouth daily at 6 (six) AM. 30 tablet 11   esomeprazole (NEXIUM) 40 MG capsule Take 40 mg by mouth daily at 12 noon.     furosemide  (LASIX ) 20 MG tablet Take 20 mg by mouth daily.     gabapentin  (NEURONTIN ) 300 MG capsule Take 300 mg by mouth at bedtime.     hydrALAZINE  (APRESOLINE ) 100 MG tablet Take 100 mg by mouth 2 (two) times daily.     irbesartan  (AVAPRO ) 300 MG tablet Take 300 mg by mouth daily.     labetalol  (NORMODYNE ) 200 MG tablet Take 200 mg by mouth 2 (two) times daily.     loratadine  (CLARITIN ) 10 MG tablet Take 10 mg by mouth daily.     magnesium  oxide (MAG-OX) 400 MG tablet Take 400 mg by mouth 2 (two) times daily.     methenamine  (HIPREX ) 1 g tablet Take 1 g by mouth 2 (two) times daily.     omega-3 fish oil (MAXEPA) 1000 MG CAPS capsule Take 1 capsule by mouth daily.     OZEMPIC, 2  MG/DOSE, 8 MG/3ML SOPN Inject 2 mg into the skin once a week.     polycarbophil (FIBERCON) 625 MG tablet Take 625 mg by mouth daily.     pravastatin  (PRAVACHOL ) 40 MG tablet Take 40 mg by mouth daily.     pregabalin  (LYRICA ) 100 MG capsule Take 100-200 mg by mouth See admin instructions. Take 100 mg in the AM and afternoon  and 2 capsules (200 mg) at bedtime     rOPINIRole  (REQUIP ) 1 MG tablet Take 1 mg by mouth at bedtime.     solifenacin  (VESICARE ) 5 MG tablet Take 1 tablet (5 mg total) by mouth daily. 90 tablet 4   traZODone  (DESYREL ) 50 MG tablet Take 50 mg by mouth at bedtime.     No current facility-administered medications on file prior to visit.    There are no Patient Instructions on  file for this visit. No follow-ups on file.   Jourdyn Ferrin E Ahan Eisenberger, NP

## 2023-02-13 DIAGNOSIS — J449 Chronic obstructive pulmonary disease, unspecified: Secondary | ICD-10-CM | POA: Diagnosis not present

## 2023-02-21 DIAGNOSIS — U071 COVID-19: Secondary | ICD-10-CM | POA: Diagnosis not present

## 2023-02-21 DIAGNOSIS — B9689 Other specified bacterial agents as the cause of diseases classified elsewhere: Secondary | ICD-10-CM | POA: Diagnosis not present

## 2023-02-21 DIAGNOSIS — J208 Acute bronchitis due to other specified organisms: Secondary | ICD-10-CM | POA: Diagnosis not present

## 2023-02-21 DIAGNOSIS — R6889 Other general symptoms and signs: Secondary | ICD-10-CM | POA: Diagnosis not present

## 2023-03-13 DIAGNOSIS — J449 Chronic obstructive pulmonary disease, unspecified: Secondary | ICD-10-CM | POA: Diagnosis not present

## 2023-03-18 DIAGNOSIS — G4733 Obstructive sleep apnea (adult) (pediatric): Secondary | ICD-10-CM | POA: Diagnosis not present

## 2023-03-18 DIAGNOSIS — I272 Pulmonary hypertension, unspecified: Secondary | ICD-10-CM | POA: Diagnosis not present

## 2023-03-18 DIAGNOSIS — J449 Chronic obstructive pulmonary disease, unspecified: Secondary | ICD-10-CM | POA: Diagnosis not present

## 2023-03-26 DIAGNOSIS — E66811 Obesity, class 1: Secondary | ICD-10-CM | POA: Diagnosis not present

## 2023-03-26 DIAGNOSIS — G4733 Obstructive sleep apnea (adult) (pediatric): Secondary | ICD-10-CM | POA: Diagnosis not present

## 2023-03-26 DIAGNOSIS — N183 Chronic kidney disease, stage 3 unspecified: Secondary | ICD-10-CM | POA: Diagnosis not present

## 2023-03-26 DIAGNOSIS — I272 Pulmonary hypertension, unspecified: Secondary | ICD-10-CM | POA: Diagnosis not present

## 2023-03-26 DIAGNOSIS — I10 Essential (primary) hypertension: Secondary | ICD-10-CM | POA: Diagnosis not present

## 2023-03-26 DIAGNOSIS — E119 Type 2 diabetes mellitus without complications: Secondary | ICD-10-CM | POA: Diagnosis not present

## 2023-03-26 DIAGNOSIS — I739 Peripheral vascular disease, unspecified: Secondary | ICD-10-CM | POA: Diagnosis not present

## 2023-03-26 DIAGNOSIS — E782 Mixed hyperlipidemia: Secondary | ICD-10-CM | POA: Diagnosis not present

## 2023-03-26 DIAGNOSIS — J449 Chronic obstructive pulmonary disease, unspecified: Secondary | ICD-10-CM | POA: Diagnosis not present

## 2023-04-13 DIAGNOSIS — J449 Chronic obstructive pulmonary disease, unspecified: Secondary | ICD-10-CM | POA: Diagnosis not present

## 2023-05-01 DIAGNOSIS — M79604 Pain in right leg: Secondary | ICD-10-CM | POA: Diagnosis not present

## 2023-05-01 DIAGNOSIS — G2581 Restless legs syndrome: Secondary | ICD-10-CM | POA: Diagnosis not present

## 2023-05-01 DIAGNOSIS — M545 Low back pain, unspecified: Secondary | ICD-10-CM | POA: Diagnosis not present

## 2023-05-01 DIAGNOSIS — I6523 Occlusion and stenosis of bilateral carotid arteries: Secondary | ICD-10-CM | POA: Diagnosis not present

## 2023-05-01 DIAGNOSIS — G629 Polyneuropathy, unspecified: Secondary | ICD-10-CM | POA: Diagnosis not present

## 2023-05-01 DIAGNOSIS — R202 Paresthesia of skin: Secondary | ICD-10-CM | POA: Diagnosis not present

## 2023-05-01 DIAGNOSIS — G8929 Other chronic pain: Secondary | ICD-10-CM | POA: Diagnosis not present

## 2023-05-01 DIAGNOSIS — M79605 Pain in left leg: Secondary | ICD-10-CM | POA: Diagnosis not present

## 2023-05-01 DIAGNOSIS — R200 Anesthesia of skin: Secondary | ICD-10-CM | POA: Diagnosis not present

## 2023-05-03 DIAGNOSIS — E1159 Type 2 diabetes mellitus with other circulatory complications: Secondary | ICD-10-CM | POA: Diagnosis not present

## 2023-05-03 DIAGNOSIS — E1142 Type 2 diabetes mellitus with diabetic polyneuropathy: Secondary | ICD-10-CM | POA: Diagnosis not present

## 2023-05-03 DIAGNOSIS — I152 Hypertension secondary to endocrine disorders: Secondary | ICD-10-CM | POA: Diagnosis not present

## 2023-05-13 DIAGNOSIS — J449 Chronic obstructive pulmonary disease, unspecified: Secondary | ICD-10-CM | POA: Diagnosis not present

## 2023-05-13 DIAGNOSIS — N1832 Chronic kidney disease, stage 3b: Secondary | ICD-10-CM | POA: Diagnosis not present

## 2023-05-13 DIAGNOSIS — E1122 Type 2 diabetes mellitus with diabetic chronic kidney disease: Secondary | ICD-10-CM | POA: Diagnosis not present

## 2023-05-15 DIAGNOSIS — E1122 Type 2 diabetes mellitus with diabetic chronic kidney disease: Secondary | ICD-10-CM | POA: Diagnosis not present

## 2023-05-15 DIAGNOSIS — I10 Essential (primary) hypertension: Secondary | ICD-10-CM | POA: Diagnosis not present

## 2023-05-15 DIAGNOSIS — R809 Proteinuria, unspecified: Secondary | ICD-10-CM | POA: Diagnosis not present

## 2023-05-15 DIAGNOSIS — N1832 Chronic kidney disease, stage 3b: Secondary | ICD-10-CM | POA: Diagnosis not present

## 2023-05-15 NOTE — Progress Notes (Signed)
 Follow Up Visit   Patient Name: Jenna Olson, female   Patient DOB: 31-May-1945 Date of Service: 05/15/2023  Patient MRN: [id number] Provider Creating Note: Bonnell Sherry, MD  [phone] Primary Care Physician:   [address] [place] [address] Additional Physicians/ Providers:    History of Present Illness Jenna Olson is a 78 y.o. female who is following up today for diabetes mellitus type 2 with chronic kidney disease, chronic kidney disease stage IIIB, proteinuria, and hypertension.  The patient's chronic kidney disease appears to be stable most recent EGFR 34 with micral and creatinine ratio of 156.  In regards to her underlying glycemic control most recent hemoglobin A1c was 6.1.  InterVax hypertension blood pressure currently 128/62.  Patient denies nausea, vomiting, dysgeusia, or pruritus.  Medications   Current Outpatient Medications:  .  acetaminophen  (TYLENOL ) 500 MG tablet, Take 1,000 mg by mouth every 6 (six) hours if needed, Disp: , Rfl:  .  albuterol HFA (PROVENTIL HFA;VENTOLIN HFA) 108 (90 Base) MCG/ACT inhaler, INHALE 2 INHALATIONS INTO THE LUNGS EVERY 6 HOURS AS NEEDED FOR WHEEZING, Disp: , Rfl:  .  aspirin  (ST JOSEPH) 81 MG EC tablet, Take by mouth daily, Disp: , Rfl:  .  b complex vitamins capsule, Take 1 capsule by mouth 1 (one) time each day, Disp: , Rfl:  .  budesonide (RHINOCORT AQ) 32 MCG/ACT nasal spray, Administer 1 spray into affected nostril(s) daily, Disp: , Rfl:  .  Calcium  Polycarbophil (Fiber) 625 MG tablet, Take 625 mg by mouth, Disp: , Rfl:  .  carboxymethylcellulose (REFRESH PLUS) 0.5 % solution, Administer 1 drop into affected eye(s), Disp: , Rfl:  .  cephalexin (KEFLEX) 250 MG capsule, Take 250 mg by mouth, Disp: , Rfl:  .  cholecalciferol  (VITAMIN D -3) 25 MCG (1000 UT) capsule, Take 1,000 Units by mouth 1 (one) time each day, Disp: , Rfl:  .  cloNIDine (CATAPRES) 0.1 MG tablet, Take 1 tablet (0.1 mg total) by mouth in the morning and 1 tablet (0.1  mg total) in the evening., Disp: 60 tablet, Rfl: 11 .  clopidogrel  (PLAVIX ) 75 MG tablet, Take 75 mg by mouth 1 (one) time each day, Disp: , Rfl:  .  cyanocobalamin  (VITAMIN B-12) 250 MCG tablet, Take 250 mcg by mouth, Disp: , Rfl:  .  diphenhydrAMINE  (SOMINEX) 25 MG tablet, Take 25 mg by mouth, Disp: , Rfl:  .  esomeprazole (NexIUM) 40 MG DR capsule, Take 40 mg by mouth in the morning., Disp: , Rfl:  .  ferrous sulfate 325 (65 Fe) MG tablet, Take 325 mg by mouth in the morning., Disp: , Rfl:  .  furosemide  (LASIX ) 20 MG tablet, Take 20 mg by mouth every morning, Disp: , Rfl:  .  gabapentin  (NEURONTIN ) 300 MG capsule, 300 mg in the morning, 900 mg at bedtime, Disp: , Rfl:  .  glimepiride (AMARYL) 4 MG tablet, Take 4 mg tablet by mouth each morning, Disp: , Rfl:  .  hydrALAZINE  100 MG tablet, Take 100 mg by mouth in the morning and 100 mg in the evening., Disp: , Rfl:  .  irbesartan  (AVAPRO ) 300 MG tablet, Take 300 mg by mouth in the morning., Disp: , Rfl:  .  labetalol  (NORMODYNE ) 200 MG tablet, Take 100 mg by mouth in the morning and 100 mg in the evening., Disp: , Rfl:  .  loratadine  (CLARITIN ) 10 MG tablet, Take 10 mg by mouth, Disp: , Rfl:  .  magnesium  oxide 400 (240 Mg) MG tablet,  Take 1 tablet by mouth in the morning and 1 tablet in the evening., Disp: , Rfl:  .  methenamine  (HIPREX ) 1 g tablet, Take 1 g by mouth in the morning and 1 g in the evening., Disp: , Rfl:  .  Ozempic, 1 MG/DOSE, 4 MG/3ML solution pen-injector, INJECT 1MG  SUBCUTANEOUSLY ONCE WEEKLY (EVERY 7 DAYS), Disp: , Rfl:  .  pravastatin  (PRAVACHOL ) 40 MG tablet, Take 40 mg by mouth in the morning., Disp: , Rfl:  .  pregabalin  (LYRICA ) 75 MG capsule, TAKE 1 CAPSULE BY MOUTH TWICE DAILY IN THE MORNING & IN THE AFTERNOON THEN TAKE2 CAPSULES AT BEDTIME, Disp: , Rfl:  .  rOPINIRole  (REQUIP ) 1 MG tablet, Take 1 tablet by mouth at bed time, Disp: , Rfl:  .  solifenacin  (VESICARE ) 5 MG tablet, Take 5 mg by mouth in the morning.,  Disp: , Rfl:  .  traZODone  (DESYREL ) 50 MG tablet, , Disp: , Rfl:    Allergies Atorvastatin, Hydrochlorothiazide, Sulfa antibiotics, and Amlodipine  Problem List Patient Active Problem List  Diagnosis  . Chronic kidney disease stage 3 (HCC)  . Edema  . Benign essential hypertension     Review of Systems  Constitutional:  Negative for chills, fever and malaise/fatigue.  Respiratory:  Negative for cough and shortness of breath.   Cardiovascular:  Negative for chest pain and palpitations.  Gastrointestinal:  Negative for nausea and vomiting.  Genitourinary:  Negative for dysuria, hematuria and urgency.     History Past Medical History:  Diagnosis Date  . Anemia   . Benign essential hypertension   . Chronic airway obstruction, not elsewhere classified (HCC)   . Chronic kidney disease stage 3 (HCC)   . Diabetes mellitus without mention of complication, type II or unspecified type, not stated as uncontrolled (HCC)   . Edema   . Esophageal reflux   . Osteoarthrosis, unspecified whether generalized or localized, involving unspecified site   . Other and unspecified hyperlipidemia   . Sleep apnea   . Urinary tract infection     Past Surgical History:  Procedure Laterality Date  . ANGIOPLASTY    . BREAST BIOPSY Left   . CHOLECYSTECTOMY    . HYSTERECTOMY    . SINUS SURGERY    . TONSILLECTOMY     Family History  Problem Relation Age of Onset  . Heart disease Mother   . Hypertension Mother   . Hypertension Father   . Cancer Father   . Hypertension Sister    Social History   Tobacco Use  . Smoking status: Former    Current packs/day: 0.00    Types: Cigarettes    Quit date: 1991    Years since quitting: 34.3  . Smokeless tobacco: Current  . Tobacco comments:    Nicotine gum  Substance Use Topics  . Alcohol  use: Never        Physical Exam  Vitals BP 128/62 (BP Location: Right upper arm, Patient Position: Sitting)   Pulse 66   Temp 98.3 F   Wt 188 lb  (85.3 kg)   SpO2 92%   BMI 34.39 kg/m   PHYSICAL EXAM: General appearance: well developed, well nourished, NAD Eyes: anicteric sclerae, moist conjunctivae; no lid-lag  HENT: Atraumatic; hearing intact Neck: Trachea midline; supple Lungs: CTAB, with normal respiratory effort  CV: S1S2, no murmurs or rubs. Abdomen: Soft, non-tender; bowel sounds present Extremities: No peripheral edema Skin: Warm and dry, normal skin turgor, no rashes noted. Psych: Appropriate affect, alert and oriented  to person, place and time    Laboratory Studies  Chemistry  Lab Units 05/13/23 1135 12/24/22 1616 09/13/22 1401 06/12/22 1441 04/16/22 1438 12/11/21 1442 08/07/21 1410 05/30/21 1433  SODIUM mmol/L 139 137 135 131* 133* 139 138 136  POTASSIUM mmol/L 5.2 4.9 6.1* 5.3* 5.1 5.0 5.3 4.9  CHLORIDE mmol/L 98 99 96* 93* 93* 98 100 100  CO2 mmol/L 32 29.5 29 32.0 26 28.9 29 32.3*  ANION GAP   --   --   --  11.3  --   --   --  8.6  MAGNESIUM  mg/dL  --  2.1  --   --   --   --   --   --   CALCIUM  mg/dL 89.8 9 9.6 9.6 9.8 9.7 9.6 9.3  PHOSPHORUS mg/dL 3.7  --  4.7*  --  3.4  --  3.2  --   ALK PHOS U/L  --  72  --   --   --  58  --   --   PTH pg/mL 46  --  78*  --  43  --  72  --   GLUCOSE mg/dL 854* 93 891* 92 85 886* 140* 109  ALBUMIN g/dL 4.3 4.4 4.3  --  4.5 4.3 4.2  --   BUN mg/dL 22 33* 34* 32* 25 22 23 25   CREATININE mg/dL 8.43* 1.5* 7.87* 2.1* 8.33* 1.7* 1.76* 1.6*    Iron Studies  Lab Units 12/24/22 1616  IRON ug/dL 54  FERRITIN ng/mL 12  TIBC ug/dL 547.1    CBC  Lab Units 05/13/23 1135 09/13/22 1401 04/16/22 1438 08/07/21 1410  WBC AUTO Thousand/uL 7.7 8.6 8.8 10.3  HEMOGLOBIN g/dL 85.4 87.2 85.7 84.9  HEMATOCRIT % 44.1 38.5 44.1 46.1*  MCV fL 89.3 88.3 88 90.7  PLATELETS AUTO Thousand/uL 295 345 322 302    Urine  Lab Units 12/24/22 1616 09/13/22 1401 04/16/22 1438 08/07/21 1410  PROT/CREAT RATIO UR mg/g creat  --  0.241*  241* 258* 0.377*  377*  ALB MG/G CREAT UR  ug/mg 156.3*  --   --   --     Lab Results  Component Value Date   PTH 46 05/13/2023   CALCIUM  10.1 05/13/2023   PHOS 3.7 05/13/2023     Imaging and Other Studies  03/03/2020: Negative SPEP/UPEP/ANA   Orders Placed This Encounter  . CBC and Differential  . Renal Function Panel  . PTH, Intact  . Urine Albumin / Creatinine Ratio        Impression/Recommendations  Jenna Olson is a 78 y.o. female with past medical history of GERD, diabetes mellitus type 2, hypertension, diverticulosis, intestinal angiectasia's, restless leg syndrome, vitamin D  deficiency, left carotid artery stenosis status post left carotid artery stent placement who was referred the evaluation of chronic kidney disease stage IV.   1.  Diabetes mellitus type 2 with chronic kidney disease/chronic kidney disease stage IIIb/proteinuria.  The patient has stable chronic kidney disease most recent EGFR 34 with micral and creatinine ratio 156 and hemoglobin A1c that was down to 6.1.  We commended her efforts at glycemic control.  We plan to maintain the patient on irbesartan .  She previously did not tolerate SGLT2 inhibitor.  Continue to monitor renal parameters over time.  2.  Hypertension.  Blood pressure under good control at 128/62.  Patient to be maintained on clonidine, hydralazine , Avapro , labetalol .  Return in about 4 months (around 09/15/2023).   Munsoor Lateef, MD

## 2023-05-28 ENCOUNTER — Encounter (INDEPENDENT_AMBULATORY_CARE_PROVIDER_SITE_OTHER): Payer: Self-pay

## 2023-06-24 DIAGNOSIS — G629 Polyneuropathy, unspecified: Secondary | ICD-10-CM | POA: Diagnosis not present

## 2023-06-24 DIAGNOSIS — M79605 Pain in left leg: Secondary | ICD-10-CM | POA: Diagnosis not present

## 2023-06-24 DIAGNOSIS — G2581 Restless legs syndrome: Secondary | ICD-10-CM | POA: Diagnosis not present

## 2023-06-24 DIAGNOSIS — M79604 Pain in right leg: Secondary | ICD-10-CM | POA: Diagnosis not present

## 2023-06-24 DIAGNOSIS — M545 Low back pain, unspecified: Secondary | ICD-10-CM | POA: Diagnosis not present

## 2023-06-24 DIAGNOSIS — G8929 Other chronic pain: Secondary | ICD-10-CM | POA: Diagnosis not present

## 2023-06-24 NOTE — Progress Notes (Signed)
 Today the history is gathered from: 100% - patient  0% - alone again  REFERRING PHYSICIAN: Lane Arthea Locus, MD PRIMARY CARE PHYSICIAN:  Steva Clotilda Conn, NP  IMPRESSION/PLAN  Jenna Olson is a 78 y.o. female presenting for evaluation of  NUMBNESS AND TINGLING/ LEG PAIN/ RESTLESS LEGS/ CAROTID STENOSIS  Improved  Patient presents for follow-up of neuropathy symptoms which have improved with increased gabapentin .  She is not tolerating gabapentin  300 mg in the morning and 100 mg at night.  Overall patient feels well and does not want to make medication changes at this time which is reasonable. Continue gabapentin  300 mg in the morning and 900 mg at night Continue nortriptyline 20 mg nightly Continue Lyrica  100 mg in the morning 200 mg at night Continue ropinirole  1 mg daily Could consider increasing Lyrica  for neuropathy at future visit if symptoms worsen.   Follow up with Sherran Berliner, PA-C, in 6 months, sooner if needed.    MEDICATIONS PREVIOUSLY TRIED   CHIEF COMPLAINT & HPI  Jenna Olson is a 78 y.o. female presenting for evaluation of: Chief Complaint  Patient presents with  . NUMBNESS AND TINGLING/ LEG PAIN/ RESTLESS LEGS  . Carotid Artery Stenosis    NUMBNESS AND TINGLING/ LEG PAIN/ RESTLESS LEGS/ CAROTID STENOSIS Patient presents for follow-up of neuropathy symptoms which have improved with increased gabapentin .  She is not tolerating gabapentin  300 mg in the morning and 100 mg at night.  Overall patient feels well and does not want to make medication changes at this time which is reasonable.  DATA SUMMARY: 01/08/2023 VAS US  CAROTID Summary:  Right Carotid: Velocities in the right ICA are consistent with a 40-59%                 stenosis. Non-hemodynamically significant plaque <50% noted in                 the CCA. The ECA appears <50% stenosed.   Left Carotid: Velocities in the left ICA are consistent with a 40-59% stenosis.                The ECA  appears <50% stenosed. Patent ICA/CCA stent agin seen with                mild narrowing at the proximal ICA. Improved flow through this                narrowing compared to previous study.   Vertebrals:  Bilateral vertebral arteries demonstrate antegrade flow.  Subclavians: Normal flow hemodynamics were seen in bilateral subclavian               arteries.   10/03/2022 VAS US  CAROTID Summary:  Right Carotid: Velocities in the right ICA are consistent with a 40-59%                 stenosis. Non-hemodynamically significant plaque <50% noted in                 the CCA. The ECA appears <50% stenosed.   Left Carotid: Velocities in the left ICA are consistent with a 40-59% stenosis.                Patent CCA/ICA stent with mild narrowing at the origin of the ICA.                Improved flow compared to previous study of 09/08/2022.   Vertebrals:  Bilateral vertebral arteries demonstrate antegrade flow.  Subclavians: Normal  flow hemodynamics were seen in bilateral subclavian               arteries.   09/08/2022 MR BRAIN WO CONTRAST IMPRESSION:  No evidence of acute intracranial abnormality.   09/08/2022 MR ANGIO HEAD WO CONTRAST IMPRESSION:  No evidence of acute intracranial abnormality.   09/08/2022 MR ANGIO NECK WO CONTRAST IMPRESSION:  No large vessel occlusion or proximal hemodynamically significant  stenosis.   09/08/2022 US  CAROTID DUPLEX BILATERAL IMPRESSION:  1. Large atherosclerotic plaque in the right carotid bulb causes  severe narrowing (70-99%).  2. A stent has been placed across the left carotid bulb (performed  08/27/2022). In stent velocities reach 200 cm/s. Velocity based  stenosis criteria applicable to native carotid arteries do not apply  to carotid artery stents. This exam can serve as a baseline for  comparison.  3. Small hematoma without vascular flow near the left subclavian  artery/vein   09/08/2022 CT HEAD WO CONTRAST IMPRESSION:  1. No acute  intracranial process.  2. Mild cortical atrophy and chronic ischemic white matter changes,  within normal limits for patient age.   07/17/2022 VAS US  CAROTID IMPRESSION: Right Carotid: Velocities in the right ICA are consistent with a 60-79%                 stenosis. The ECA appears >50% stenosed.   Left Carotid: Velocities in the left ICA are consistent with a 80-99% stenosis.                The ECA appears >50% stenosed.   03/09/21 BILAT CAROTID US  Impression:  1. Advanced atherosclerotic plaque of the bilateral carotid bulbs results in bilateral severe greater than 70% stenoses by doppler criteria.  2. Bilateral vertebral arteries demonstrate normal antegrade flow.  07/02/2018 EMG Abnormal study. 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  VISIT SUMMARIES:  03/29/2021: - Patient stated recently legs are bothering her more at night.  - Takes Gabapentin  300 mg at night, Requip  1 mg at night, and Lyrica  100 mg twice a day and 200 mg at night. She did note that most days she does not have to take Lyrica  in the afternoon Increase Gabapentin  to 600 mg at night for 2 weeks, if symptoms have not resolved can increase to 900 mg at night if needed Refill Requip  1 mg at night.Refill Lyrica  100, 100, 200 mg. Follow primary care physician for management of gout.  09/07/20:  Increase Lyrica  to 100 mg in the morning 100 mg in the afternoon (if needed) and 200 mg at night. Continue taking Gabapentin  300 mg nightly. Continue taking Requip  1 mg at night.  09/28/19: Symptoms improving. Continue taking gabapentin  300 mg nightly, refilled in 90 day supplies for 1 year.  Continue taking Lyrica  75 mg in the morning, 75 mg in the afternoon, and 150 mg in the evening, refilled for 1 year.  Continue taking Requip  1 mg nightly, refilled for 1 year.  05/04/2019: Patient with neuropathy in the lower extremities, leg pain, and restless legs, ongoing. Start  taking gabapentin  300 mg at night.  Continue taking Lyrica  75 mg in the morning, 75 mg in the afternoon as needed, and 150 mg at night, refilled. Continue taking Requip  1 mg nightly, refilled.   11/03/18: Patient with neuropathy in the lower extremities, improving. Taking Lyrica  75 mg int he morning, 75 mg in the afternoon as needed, and 150 mg at night, refilled. Taking Requip  1 mg nightly for RLS, refilled  MEDICATIONS Current Outpatient Medications  Medication Sig Dispense Refill  . acetaminophen  (TYLENOL ) 500 MG tablet Take 1,000 mg by mouth every 6 (six) hours as needed       . albuterol MDI, PROVENTIL, VENTOLIN, PROAIR, HFA 90 mcg/actuation inhaler Inhale 2 inhalations into the lungs every 6 (six) hours as needed for Wheezing 3 each 2  . aspirin  81 MG EC tablet Take by mouth once daily.      . azelastine  (ASTELIN ) 137 mcg nasal spray Place 2 sprays into both nostrils 2 (two) times daily as needed 30 mL prn  . b complex vitamins capsule Take 1 capsule by mouth once daily    . calcium  polycarbophiL (FIBERCON) 625 mg tablet Take 625 mg by mouth as directed (constipation) One in am, two in pm    . CARBOXYMETHYL/GLYCERIN/POLY80 (REFRESH OPTIVE ADVANCED OPHTH) Apply 1 drop to eye every 2 (two) hours as needed.    . cephalexin (KEFLEX) 250 MG capsule Take 1 capsule (250 mg total) by mouth at bedtime 90 capsule 3  . cholecalciferol , vitamin D3, (VITAMIN D3) 125 mcg (5,000 unit) tablet Take 5,000 Units by mouth once daily    . clopidogreL  (PLAVIX ) 75 mg tablet Take 75 mg by mouth once daily    . diclofenac (VOLTAREN) 1 % topical gel Apply 2 g topically 4 (four) times daily as needed    . diphenhydrAMINE  (BENADRYL ) 25 mg tablet Take 25 mg by mouth at bedtime    . esomeprazole (NEXIUM) 40 MG DR capsule Take 1 capsule (40 mg total) by mouth once daily 90 capsule 3  . ferrous fumarate/vit Bcomp,C (SUPER B COMPLEX ORAL) Take 1 tablet by mouth once daily    . fluticasone propionate (FLONASE) 50  mcg/actuation nasal spray USE 1-2 SPRAYS IN EACH NOSTRIL ONCE DAILY AS NEEDED ALLERGIES 16 g prn  . FUROsemide  (LASIX ) 20 MG tablet Take 1 tablet (20 mg total) by mouth once daily 90 tablet 3  . gabapentin  (NEURONTIN ) 300 MG capsule Take gabapentin  300 mg in AM and 900 mg at night. 360 capsule 3  . hydrALAZINE  (APRESOLINE ) 100 MG tablet Take 1 tablet (100 mg total) by mouth 2 (two) times daily 180 tablet 3  . irbesartan  (AVAPRO ) 300 MG tablet Take 1 tablet (300 mg total) by mouth once daily 90 tablet 3  . labetaloL  (TRANDATE ) 200 MG tablet Take 0.5 tablets (100 mg total) by mouth 2 (two) times daily 90 tablet 3  . loratadine  (CLARITIN ) 10 mg tablet Take 10 mg by mouth once daily as needed for Allergies    . magnesium  oxide (MAG-OX) 400 mg (241.3 mg magnesium ) tablet TAKE ONE TABLET BY MOUTH TWICE DAILY 180 tablet 1  . methenamine  hippurate (HIPREX ) 1 gram tablet Take 1 tablet (1 g total) by mouth 2 (two) times daily 60 tablet 11  . nortriptyline (PAMELOR) 10 MG capsule take 2 capsules BY MOUTH nightly 60 capsule 3  . omega 3-dha-epa-fish oil 1,000 mg (120 mg-180 mg) Cap Take 1 capsule by mouth once daily    . pravastatin  (PRAVACHOL ) 40 MG tablet Take 1 tablet (40 mg total) by mouth once daily 90 tablet 3  . pregabalin  (LYRICA ) 100 MG capsule take 1 capsule by mouth every morning & take 2 capsules nightly 90 capsule 1  . rOPINIRole  (REQUIP ) 1 MG immediate release tablet Take 1 tablet (1 mg total) by mouth at bedtime 90 tablet 0  . semaglutide (OZEMPIC) 2 mg/dose (8 mg/3 mL) pen injector Inject 0.75 mLs (2 mg total)  subcutaneously every 7 (seven) days 3 mL 12  . solifenacin  (VESICARE ) 5 MG tablet Take 1 tablet (5 mg total) by mouth once daily 90 tablet 3  . traZODone  (DESYREL ) 50 MG tablet Take 1 tablet (50 mg total) by mouth at bedtime 90 tablet 3   No current facility-administered medications for this visit.    ALLERGIES Allergies  Allergen Reactions  . Atorvastatin Liver Disorder and Other  (See Comments)    Elevated lft's Other reaction(s): Other (See Comments) Elevated lft's Elevated lft's  . Hydrochlorothiazide Other (See Comments)    Hyponatremia Other reaction(s): Other (See Comments), Other (See Comments) Hyponatremia Hyponatremia  . Amoxicillin Rash    Rash in her mouth.  Painful.  . Sulfa (Sulfonamide Antibiotics) Unknown and Other (See Comments)    Child hood allergy  . Amlodipine Swelling     EXAM   Vitals:   06/24/23 1353  BP: (!) 150/58  Weight: 85.3 kg (188 lb)  Height: 157.5 cm (5' 2)  PainSc: 0-No pain    Body mass index is 34.39 kg/m.   GENERAL: Pleasant female.  No acute distress.. Normocephalic and atraumatic.  The baseline comprehensive neurological exam obtained at prior office visit.  Significant changes from today's visit are shown in bold.  MUSCULOSKELETAL: Bulk - Normal Tone - Normal Pronator Drift - Absent bilaterally. Ambulation - Gait and station is wnl for age    PAST MEDICAL HISTORY Past Medical History:  Diagnosis Date  . Allergic rhinitis   . Asthma without status asthmaticus (HHS-HCC)   . Benign essential hypertension 06/18/2014  . Carotid atherosclerosis, bilateral 10/19/2013  . Colon polyp   . COPD (chronic obstructive pulmonary disease) (CMS/HHS-HCC)   . Diverticulosis 05/03/2014  . Encounter for blood transfusion   . Fracture of fibula, distal, left, closed 09/27/2018  . Fracture of fibula, distal, left, closed 09/27/2018  . Gastroesophageal reflux disease with esophagitis 10/19/2013  . History of adenomatous polyp of colon 10/06/2010   repeat due 09/2013  . History of Barrett's esophagus    there was no evidence of barrett 2012  . Insomnia 10/19/2013  . Internal hemorrhoids 05/03/2014  . 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 Jacobo  . Mitral valve regurgitation   . Mixed hyperlipidemia 10/19/2013  . Osteoarthritis  10/19/2013  . Right rotator cuff tear 01/24/2016  . RLS (restless legs syndrome)   . Sacroiliitis () 11/03/2014  . Sleep apnea 10/19/2013   on nocturnal O2; intolerant of CPAP   . SOBOE (shortness of breath on exertion) 04/09/2017  . Type 2 diabetes mellitus, controlled, with renal complications (CMS/HHS-HCC) 10/19/2013   Had initial Lifestyles class 11/17/2013; refused further sessions   . Vitamin D  deficiency     PAST SURGICAL HISTORY Past Surgical History:  Procedure Laterality Date  . HYSTERECTOMY  1980   TAH  . BREAST EXCISIONAL BIOPSY Left 1984   Benign cyst  . CHOLECYSTECTOMY  03/2010  . COLONOSCOPY  10/06/2010   tubular adenoma  . EGD  10/06/2010   no repeat needed (MUS)  . Open release right wrist 1st dorsal compartment  02/01/2012  . JOINT REPLACEMENT Right 11/12/2012   Right knee medial MAKOplasty.  . ENDOVASCULAR TRANSLUMINAL BALLOON ANGIOPLASTY ILIAC ARTERY W/STENT Bilateral 01/27/2013  . COLONOSCOPY  05/03/2014   Dr. EMERSON Mariner @ [facility] - Tubular adenoma/Rpt 48yrs/MUS  . EXTRACTION CATARACT EXTRACAPSULAR W/INSERTION INTRAOCULAR PROSTHESIS Left 03/15/2015   Procedure: Cataract Extraction with intraocular lens implantation left  eye;  Surgeon: Peggy Emma Bring, MD;  Location: [facility] OR;  Service: Ophthalmology;  Laterality: Left;  . EXTRACTION CATARACT EXTRACAPSULAR W/INSERTION INTRAOCULAR PROSTHESIS Right 03/22/2015   Procedure: Cataract Extraction with intraocular lens implantation, right eye;  Surgeon: Peggy Emma Bring, MD;  Location: [facility] OR;  Service: Ophthalmology;  Laterality: Right;  . BLEPHAROPLASTY UPPER EYELID Bilateral 02/14/2016  . COLONOSCOPY  02/18/2018   Tubular adenoma of the colon/Repeat 77yrs/MUS  . left endovascular carotid stent placement Left 08/27/2022  . Sinus surgery     Done for aspergillosis.  . TONSILLECTOMY AND ADENOIDECTOMY  78 years old    FAMILY HISTORY Family History  Problem Relation Name Age of Onset  . Myocardial  Infarction (Heart attack) Mother    . High blood pressure (Hypertension) Mother    . Rheum arthritis Mother    . Dementia Mother    . Hypothyroidism Mother    . Cataracts Mother    . High blood pressure (Hypertension) Father    . Cancer Father         head/neck cancer  . High blood pressure (Hypertension) Sister    . Cancer Grandchild         Ewing's sarcoma  . Colon cancer Maternal Uncle    . Hypothyroidism Brother    . Migraines Brother    . Hypothyroidism Daughter    . Colon polyps Neg Hx    . Liver disease Neg Hx    . Ulcers Neg Hx    . Glaucoma Neg Hx    . Macular degeneration Neg Hx    . Diabetes Neg Hx    . Anesthesia problems Neg Hx    . Malignant hypertension Neg Hx      SOCIAL HISTORY  Social History   Tobacco Use  . Smoking status: Former    Current packs/day: 0.00    Average packs/day: 1.5 packs/day for 20.0 years (30.0 ttl pk-yrs)    Types: Cigarettes    Start date: 10/09/1971    Quit date: 10/09/1991    Years since quitting: 31.7    Passive exposure: Past  . Smokeless tobacco: Never  Vaping Use  . Vaping status: Never Used  Substance Use Topics  . Alcohol  use: No    Alcohol /week: 0.0 standard drinks of alcohol   . Drug use: No     REVIEW OF SYSTEMS:  13 system ROS was verbally reviewed with patient. Pertinent positives and negatives are mentioned above in the HPI and all other systems are negative.  DATA  I have personally reviewed all of the data outlined below both prior to the appointment and during the appointment with the patient as appropriate.  Office Visit on 05/03/2023  Component Date Value Ref Range Status  . Hemoglobin A1C 05/03/2023 6.1 (H)  4.2 - 5.6 % Final  . Average Blood Glucose (Calc) 05/03/2023 128  mg/dL Final  Urgent Care Visit on 02/21/2023  Component Date Value Ref Range Status  . POC Influenza A RNA 02/21/2023 Not Detected  Not Detected Final  . POC Influenza B RNA 02/21/2023 Not Detected  Not Detected Final  . POC  Respiratory Syncytial Virus (R* 02/21/2023 Not Detected  Not Detected Final  . POC Coronavirus (COVID-19) SARS-Co* 02/21/2023 DETECTED (!)  Not Detected Final   No follow-ups on file.  Payor: [employer] MEDICARE ADVANTAGE PLANS / Plan: [employer] PPO CHOICE / Product Type: PPO /  This note is partially written by Lauraine Hales, scribe, in the presence of and acting as the scribe  of [employer], PA-C.      Attestation Statement:   I personally performed the service, non-incident to. (WP)   CELESTE CRAFT CANTWELL, PA

## 2023-06-25 DIAGNOSIS — Z1331 Encounter for screening for depression: Secondary | ICD-10-CM | POA: Diagnosis not present

## 2023-06-25 DIAGNOSIS — D500 Iron deficiency anemia secondary to blood loss (chronic): Secondary | ICD-10-CM | POA: Diagnosis not present

## 2023-06-25 DIAGNOSIS — I129 Hypertensive chronic kidney disease with stage 1 through stage 4 chronic kidney disease, or unspecified chronic kidney disease: Secondary | ICD-10-CM | POA: Diagnosis not present

## 2023-06-25 DIAGNOSIS — Z09 Encounter for follow-up examination after completed treatment for conditions other than malignant neoplasm: Secondary | ICD-10-CM | POA: Diagnosis not present

## 2023-06-25 DIAGNOSIS — E1151 Type 2 diabetes mellitus with diabetic peripheral angiopathy without gangrene: Secondary | ICD-10-CM | POA: Diagnosis not present

## 2023-06-25 DIAGNOSIS — J449 Chronic obstructive pulmonary disease, unspecified: Secondary | ICD-10-CM | POA: Diagnosis not present

## 2023-06-25 DIAGNOSIS — J301 Allergic rhinitis due to pollen: Secondary | ICD-10-CM | POA: Diagnosis not present

## 2023-06-25 DIAGNOSIS — I872 Venous insufficiency (chronic) (peripheral): Secondary | ICD-10-CM | POA: Diagnosis not present

## 2023-06-25 DIAGNOSIS — E1140 Type 2 diabetes mellitus with diabetic neuropathy, unspecified: Secondary | ICD-10-CM | POA: Diagnosis not present

## 2023-06-25 NOTE — Progress Notes (Signed)
 Subjective:  CC: Chief Complaint  Patient presents with  . Follow-up       Patient ID: Jenna Olson is a 78 y.o. female. This an established patient is here today for Office Visit.  HPI:   Patient is watching diet some, and getting some exercise.  She is not fasting today. Pt reports she is overall doing well. [place] Hypertension/ Heart Disease is stable. Pt is taking Blood Pressure medications as instructed. No reportable episodes of hyper or hypotension. Pt denies chest pain or shortness of breath. No dizziness or lightheadedness. Pt endorses not checking blood pressure at home. Still taking Plavix  and daily ASA. Status of the chronic respiratory condition is stable. Pt is taking medications as instructed. No recent exacerbations. No cough, congestion, chest pains or shortness of breath. Pt reports minimal use of rescue inhaler. Seasonal allergies stable. Says they have not been bad for her this year. Takes Zyrtec for this. Hasn't had to use the nose sprays yet this year. Pt is taking vitamin supplements as prescribed or instructed. Pt is taking cholesterol medications as instructed. No complaints of myalgias. Pt has been taking the iron supplements. Would like to have her iron levels rechecked today. Says last time her iron levels went down, the po supplements didn't help. She had to have iron infusions. This was around 4 years ago. In regards to Diabetes, pt is doing well. Pt is taking Diabetes medications as prescribed. No known episodes of Hyper or Hypoglycemia. Pt is not checking blood glucose at home. Pt reports regular visits to the eye doctor and dentist. Pt endorses checking feet/regular foot care. Insomnia has improved. Says sometimes she may sleep 9-10 hours at night. Reflux is stable. Pt reports symptoms are well controlled on current regimen. Minimal breakthrough symptoms/ minimal use of antacids. Pt is attempting to avoid reflux triggers. She does  have times when she feels like fluid won't go down when she drinks. Can't eat rice or it will get stuck. Last endoscopy was in 2012. Followed by GI yearly. OAB stable. Still taking Vesicare . Working well. Rarely has to get up during the night to void. Has had PNA, Shingles and RSV vaccines. DEXA will be due in November. TD vaccine utd until 2031. Colonoscopy no longer indicated.        Review of Systems: Review of Systems  Constitutional:  Negative for activity change, appetite change, chills, diaphoresis, fatigue and fever.  HENT:  Negative for congestion, hearing loss, sinus pressure, sinus pain, tinnitus, trouble swallowing and voice change.   Respiratory:  Negative for cough, choking, chest tightness and shortness of breath.   Cardiovascular:  Negative for chest pain, palpitations and leg swelling.  Gastrointestinal:  Negative for abdominal distention, abdominal pain, diarrhea, nausea and vomiting.  Endocrine: Negative.   Genitourinary:  Negative for difficulty urinating.  Musculoskeletal:  Positive for back pain. Negative for arthralgias, gait problem, joint swelling and myalgias.  Skin:  Negative for rash and wound.  Neurological:  Negative for dizziness, syncope, weakness, light-headedness, numbness and headaches.  Psychiatric/Behavioral:  Negative for agitation, behavioral problems, confusion, decreased concentration, dysphoric mood, hallucinations and sleep disturbance. The patient is not nervous/anxious and is not hyperactive.     Allergies: Atorvastatin, Hydrochlorothiazide, Amoxicillin, Sulfa (sulfonamide antibiotics), and Amlodipine  Active Diagnoses/Problem List Patient Active Problem List  Diagnosis  . RLS (restless legs syndrome)  . Allergic rhinitis  . Vitamin D  deficiency  . Pulmonary nodules  . Elevated right ventricular end-diastolic  pressure  . Gastroesophageal reflux disease with esophagitis  . Sleep apnea  . Hyperlipidemia associated with type 2 diabetes  mellitus (CMS-HCC)  . Type 2 diabetes, controlled, with neuropathy (CMS/HHS-HCC)  . Osteoarthritis  . Insomnia  . Asymptomatic proteinuria  . Iron deficiency anemia due to chronic blood loss  . Status post unicompartmental knee replacement, right  . Adenomatous polyp of colon  . Benign essential hypertension  . Venous insufficiency of both lower extremities  . High risk medication use  . Irritable bowel syndrome with constipation  . COPD, mild (CMS/HHS-HCC)  . Bilateral carotid artery disease ()  . Bilateral edema of lower extremity  . PAD (peripheral artery disease) ()  . Venous reflux  . Severe obesity (BMI 35.0-39.9) with comorbidity (CMS/HHS-HCC)  . OAB (overactive bladder)  . Recurrent UTI (urinary tract infection)  . Chronic low back pain  . Pulmonary hypertension (CMS/HHS-HCC)  . Dry eyes, bilateral  . Hypomagnesemia  . Chronic kidney disease, stage 4 (severe) (CMS/HHS-HCC)    Past Medical/Surgical History: Past Medical History:  Diagnosis Date  . Allergic rhinitis   . Asthma without status asthmaticus (HHS-HCC)   . Benign essential hypertension 06/18/2014  . Carotid atherosclerosis, bilateral 10/19/2013  . Colon polyp   . COPD (chronic obstructive pulmonary disease) (CMS/HHS-HCC)   . Diverticulosis 05/03/2014  . Encounter for blood transfusion   . Fracture of fibula, distal, left, closed 09/27/2018  . Fracture of fibula, distal, left, closed 09/27/2018  . Gastroesophageal reflux disease with esophagitis 10/19/2013  . History of adenomatous polyp of colon 10/06/2010   repeat due 09/2013  . History of Barrett's esophagus    there was no evidence of barrett 2012  . Insomnia 10/19/2013  . Internal hemorrhoids 05/03/2014  . 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 Jacobo  . Mitral valve regurgitation   . Mixed hyperlipidemia 10/19/2013  . Osteoarthritis 10/19/2013  . Right rotator cuff tear  01/24/2016  . RLS (restless legs syndrome)   . Sacroiliitis () 11/03/2014  . Sleep apnea 10/19/2013   on nocturnal O2; intolerant of CPAP   . SOBOE (shortness of breath on exertion) 04/09/2017  . Type 2 diabetes mellitus, controlled, with renal complications (CMS/HHS-HCC) 10/19/2013   Had initial Lifestyles class 11/17/2013; refused further sessions   . Vitamin D  deficiency    Past Surgical History:  Procedure Laterality Date  . HYSTERECTOMY  1980   TAH  . BREAST EXCISIONAL BIOPSY Left 1984   Benign cyst  . CHOLECYSTECTOMY  03/2010  . COLONOSCOPY  10/06/2010   tubular adenoma  . EGD  10/06/2010   no repeat needed (MUS)  . Open release right wrist 1st dorsal compartment  02/01/2012  . JOINT REPLACEMENT Right 11/12/2012   Right knee medial MAKOplasty.  . ENDOVASCULAR TRANSLUMINAL BALLOON ANGIOPLASTY ILIAC ARTERY W/STENT Bilateral 01/27/2013  . COLONOSCOPY  05/03/2014   Dr. EMERSON Mariner @ [facility] - Tubular adenoma/Rpt 3yrs/MUS  . EXTRACTION CATARACT EXTRACAPSULAR W/INSERTION INTRAOCULAR PROSTHESIS Left 03/15/2015   Procedure: Cataract Extraction with intraocular lens implantation left eye;  Surgeon: Peggy Emma Bring, MD;  Location: [facility] OR;  Service: Ophthalmology;  Laterality: Left;  . EXTRACTION CATARACT EXTRACAPSULAR W/INSERTION INTRAOCULAR PROSTHESIS Right 03/22/2015   Procedure: Cataract Extraction with intraocular lens implantation, right eye;  Surgeon: Peggy Emma Bring, MD;  Location: [facility] OR;  Service: Ophthalmology;  Laterality: Right;  . BLEPHAROPLASTY UPPER EYELID Bilateral 02/14/2016  . COLONOSCOPY  02/18/2018   Tubular adenoma of  the colon/Repeat 51yrs/MUS  . left endovascular carotid stent placement Left 08/27/2022  . Sinus surgery     Done for aspergillosis.  . TONSILLECTOMY AND ADENOIDECTOMY  78 years old    Social History: Social History   Socioeconomic History  . Marital status: Widowed  Occupational History  . Occupation: retired  Tobacco Use  .  Smoking status: Former    Current packs/day: 0.00    Average packs/day: 1.5 packs/day for 20.0 years (30.0 ttl pk-yrs)    Types: Cigarettes    Start date: 10/09/1971    Quit date: 10/09/1991    Years since quitting: 31.7    Passive exposure: Past  . Smokeless tobacco: Never  Vaping Use  . Vaping status: Never Used  Substance and Sexual Activity  . Alcohol  use: Never  . Drug use: No  . Sexual activity: Defer  Social History Narrative   Married; retired from [employer] 2001   Social Drivers of Health   Financial Resource Strain: Low Risk  (06/25/2023)   Overall Financial Resource Strain (CARDIA)   . Difficulty of Paying Living Expenses: Not hard at all  Food Insecurity: No Food Insecurity (06/25/2023)   Hunger Vital Sign   . Worried About Programme researcher, broadcasting/film/video in the Last Year: Never true   . Ran Out of Food in the Last Year: Never true  Transportation Needs: No Transportation Needs (06/25/2023)   PRAPARE - Transportation   . Lack of Transportation (Medical): No   . Lack of Transportation (Non-Medical): No  Physical Activity: Inactive (06/25/2023)   Exercise Vital Sign   . Days of Exercise per Week: 0 days   . Minutes of Exercise per Session: 0 min  Stress: No Stress Concern Present (06/25/2023)   [employer] of Occupational Health - Occupational Stress Questionnaire   . Feeling of Stress : Not at all  Social Connections: Moderately Isolated (06/25/2023)   Social Connection and Isolation Panel   . Frequency of Communication with Friends and Family: More than three times a week   . Frequency of Social Gatherings with Friends and Family: Twice a week   . Attends Religious Services: More than 4 times per year   . Active Member of Clubs or Organizations: No   . Attends Banker Meetings: Never   . Marital Status: Widowed  Housing Stability: Low Risk  (06/25/2023)   Housing Stability Vital Sign   . Unable to Pay for Housing in the Last Year: No   . Number of Times  Moved in the Last Year: 0   . Homeless in the Last Year: No    Family History: Family History  Problem Relation Name Age of Onset  . Myocardial Infarction (Heart attack) Mother    . High blood pressure (Hypertension) Mother    . Rheum arthritis Mother    . Dementia Mother    . Hypothyroidism Mother    . Cataracts Mother    . High blood pressure (Hypertension) Father    . Cancer Father         head/neck cancer  . High blood pressure (Hypertension) Sister    . Cancer Grandchild         Ewing's sarcoma  . Colon cancer Maternal Uncle    . Hypothyroidism Brother    . Migraines Brother    . Hypothyroidism Daughter    . Colon polyps Neg Hx    . Liver disease Neg Hx    . Ulcers Neg Hx    .  Glaucoma Neg Hx    . Macular degeneration Neg Hx    . Diabetes Neg Hx    . Anesthesia problems Neg Hx    . Malignant hypertension Neg Hx      Immunizations: Immunization History  Administered Date(s) Administered  . COVID-19 Moderna Vaccine (1st,2nd,3rd dose = 0.47ml) 03/07/2019, 04/04/2019  . COVID-19 Pfizer Monovalent Vaccine 12/01/2019  . COVID-19 vaccine (Moderna, BIVALENT) IM injection 50 mcg/0.5mL or 77mcg/0.25mL 09/21/2020  . Flu Vaccine HD-IIV3 High Dose, IM PF(79yo+)(Fluzone) 09/18/2016, 09/26/2017, 09/10/2018, 10/05/2022  . Flu Vaccine IIV3, IM with Pres (63MO+)(Afluria, Fluzone) 10/19/2013, 10/19/2014  . Flu Vaccine allV3 adjuv,IM PF(65+)(Fluad) 10/05/2022  . Influenza IIV4, High Dose IM (65 Yr+) (FLUZONE QUAD) 09/10/2018  . Influenza TIV (IM) 10/14/2011, 10/16/2012  . Influenza, IM unspecified 10/16/2012, 10/19/2013, 10/19/2014, 09/20/2015, 10/14/2019, 09/12/2020, 09/27/2021  . PNEUMOCOCCAL (PCV13) (BIRTH-58YR) VACCINE (PREVNAR 13) 11/14/2018  . PNEUMOCOCCAL (PPSV23)(>=90YRS -OR- >=2 YRS WITH RISK) VACCINE (PNEUMOVAX 23) 09/30/2009  . RSV Adult Vaccine(>=7yr)(Arexvy) 12/17/2022  . RZV(>=33YR -OR-19+YRS IF  IMMCOMP) VACCINE (SHINGRIX) 11/11/2018, 02/04/2019  . TDAP (>=48YR) VACCINE  (ADACEL/BOOSTRIX) 06/28/2010, 05/28/2019    Goals:  Goals     . * Lose Weight (pt-stated)      Wants to lose weight.  Weight goal - under 200 pounds, then lose more after she reaches her first goal. Current weight - 201 pounds.     .  Maintain health/healthy lifestyle        Depression Screening: Depression assessment: Patient underlying factors that can contribute to depression: none  PHQ 2/9 last 3 flowsheet values    06/12/2022    1:34 PM 12/24/2022    2:58 PM 06/25/2023    1:27 PM  PHQ-2/9 Depression Screening   Little interest or pleasure in doing things 0 0 0  Feeling down, depressed, or hopeless 0 0 0  Patient Health Questionnaire-2 Score 0 * 0 * 0    * Data saved with a previous flowsheet row definition      Is the depression screen above positive? (Score >9) No, the score was 0-4  Time Spent: 0-4 min      Current Medications: Outpatient Medications Marked as Taking for the 06/25/23 encounter (Office Visit) with Steva Clotilda Conn, NP  Medication Sig Dispense Refill  . acetaminophen  (TYLENOL ) 500 MG tablet Take 1,000 mg by mouth every 6 (six) hours as needed       . albuterol MDI, PROVENTIL, VENTOLIN, PROAIR, HFA 90 mcg/actuation inhaler Inhale 2 inhalations into the lungs every 6 (six) hours as needed for Wheezing 3 each 2  . aspirin  81 MG EC tablet Take by mouth once daily.      . azelastine  (ASTELIN ) 137 mcg nasal spray Place 2 sprays into both nostrils 2 (two) times daily as needed 30 mL prn  . b complex vitamins capsule Take 1 capsule by mouth once daily    . calcium  polycarbophiL (FIBERCON) 625 mg tablet Take 625 mg by mouth as directed (constipation) One in am, two in pm    . CARBOXYMETHYL/GLYCERIN/POLY80 (REFRESH OPTIVE ADVANCED OPHTH) Apply 1 drop to eye every 2 (two) hours as needed.    . cephalexin (KEFLEX) 250 MG capsule Take 1 capsule (250 mg total) by mouth at bedtime 90 capsule 3  . cholecalciferol , vitamin D3, (VITAMIN D3) 125 mcg (5,000  unit) tablet Take 5,000 Units by mouth once daily    . clopidogreL  (PLAVIX ) 75 mg tablet Take 75 mg by mouth once daily    . diclofenac (VOLTAREN)  1 % topical gel Apply 2 g topically 4 (four) times daily as needed    . diphenhydrAMINE  (BENADRYL ) 25 mg tablet Take 25 mg by mouth at bedtime    . esomeprazole (NEXIUM) 40 MG DR capsule Take 1 capsule (40 mg total) by mouth once daily 90 capsule 3  . ferrous fumarate/vit Bcomp,C (SUPER B COMPLEX ORAL) Take 1 tablet by mouth once daily    . fluticasone propionate (FLONASE) 50 mcg/actuation nasal spray USE 1-2 SPRAYS IN EACH NOSTRIL ONCE DAILY AS NEEDED ALLERGIES 16 g prn  . FUROsemide  (LASIX ) 20 MG tablet Take 1 tablet (20 mg total) by mouth once daily 90 tablet 3  . gabapentin  (NEURONTIN ) 300 MG capsule Take gabapentin  300 mg in AM and 900 mg at night. 360 capsule 3  . hydrALAZINE  (APRESOLINE ) 100 MG tablet Take 1 tablet (100 mg total) by mouth 2 (two) times daily 180 tablet 3  . irbesartan  (AVAPRO ) 300 MG tablet Take 1 tablet (300 mg total) by mouth once daily 90 tablet 3  . labetaloL  (TRANDATE ) 200 MG tablet Take 0.5 tablets (100 mg total) by mouth 2 (two) times daily 90 tablet 3  . loratadine  (CLARITIN ) 10 mg tablet Take 10 mg by mouth once daily as needed for Allergies    . magnesium  oxide (MAG-OX) 400 mg (241.3 mg magnesium ) tablet TAKE ONE TABLET BY MOUTH TWICE DAILY 180 tablet 1  . methenamine  hippurate (HIPREX ) 1 gram tablet Take 1 tablet (1 g total) by mouth 2 (two) times daily 60 tablet 11  . nortriptyline (PAMELOR) 10 MG capsule take 2 capsules BY MOUTH nightly 60 capsule 3  . omega 3-dha-epa-fish oil 1,000 mg (120 mg-180 mg) Cap Take 1 capsule by mouth once daily    . pravastatin  (PRAVACHOL ) 40 MG tablet Take 1 tablet (40 mg total) by mouth once daily 90 tablet 3  . pregabalin  (LYRICA ) 100 MG capsule take 1 capsule by mouth every morning & take 2 capsules nightly 90 capsule 1  . rOPINIRole  (REQUIP ) 1 MG immediate release tablet Take 1  tablet (1 mg total) by mouth at bedtime 90 tablet 0  . semaglutide (OZEMPIC) 2 mg/dose (8 mg/3 mL) pen injector Inject 0.75 mLs (2 mg total) subcutaneously every 7 (seven) days 3 mL 12  . solifenacin  (VESICARE ) 5 MG tablet Take 1 tablet (5 mg total) by mouth once daily 90 tablet 3  . traZODone  (DESYREL ) 50 MG tablet Take 1 tablet (50 mg total) by mouth at bedtime 90 tablet 3    Medication list above reconciled and reviewed for current and on-going appropriateness using patient's verbal report of what she is taking.      Objective:   Vitals:   06/25/23 1322  BP: (!) 146/60  Pulse: 67   Ht:157.5 cm (5' 2) Wt:85.9 kg (189 lb 6.4 oz) AFP:Anib mass index is 34.64 kg/m.   Physical Exam Vitals and nursing note reviewed.  Constitutional:      General: She is not in acute distress.    Appearance: Normal appearance. She is well-developed. She is not diaphoretic.  HENT:     Head: Normocephalic and atraumatic.     Right Ear: Hearing and external ear normal.     Left Ear: Hearing and external ear normal.     Nose: Nose normal.     Mouth/Throat:     Pharynx: Uvula midline. No oropharyngeal exudate.   Eyes:     General: Lids are normal. No scleral icterus.  Right eye: No discharge.        Left eye: No discharge.     Conjunctiva/sclera: Conjunctivae normal.     Pupils: Pupils are equal, round, and reactive to light.   Neck:     Thyroid: No thyromegaly.     Vascular: No JVD.     Trachea: Trachea normal. No tracheal tenderness or tracheal deviation.   Cardiovascular:     Rate and Rhythm: Normal rate and regular rhythm.     Pulses: Normal pulses.          Dorsalis pedis pulses are 2+ on the right side and 2+ on the left side.     Heart sounds: Normal heart sounds. No murmur heard.    No friction rub. No gallop.  Pulmonary:     Effort: Pulmonary effort is normal. No accessory muscle usage or respiratory distress.     Breath sounds: Normal breath sounds. No stridor. No decreased  breath sounds, wheezing, rhonchi or rales.  Chest:     Chest wall: No tenderness.  Abdominal:     General: Bowel sounds are normal. There is no distension.     Palpations: Abdomen is soft.     Tenderness: There is no abdominal tenderness. There is no guarding.   Musculoskeletal:        General: No tenderness or deformity. Normal range of motion.     Cervical back: Normal range of motion and neck supple.  Lymphadenopathy:     Head:     Right side of head: No submental, submandibular, tonsillar or posterior auricular adenopathy.     Left side of head: No submental, submandibular, tonsillar or posterior auricular adenopathy.     Cervical: No cervical adenopathy.   Skin:    General: Skin is warm and dry.     Coloration: Skin is not pale.     Findings: No erythema or rash.     Nails: There is no clubbing.   Neurological:     Mental Status: She is alert and oriented to person, place, and time.     Cranial Nerves: No cranial nerve deficit.     Sensory: No sensory deficit.     Motor: No tremor, atrophy, abnormal muscle tone or seizure activity.     Coordination: Coordination normal.     Gait: Gait normal.     Deep Tendon Reflexes: Reflexes are normal and symmetric.   Psychiatric:        Mood and Affect: Mood normal.        Speech: Speech normal.        Behavior: Behavior normal.        Thought Content: Thought content normal.        Judgment: Judgment normal.         Office Visit on 06/25/2023  Component Date Value Ref Range Status  . WBC (White Blood Cell Count) 06/25/2023 8.4  4.1 - 10.2 10^3/uL Final  . RBC (Red Blood Cell Count) 06/25/2023 5.06  4.04 - 5.48 10^6/uL Final  . Hemoglobin 06/25/2023 15.1 (H)  12.0 - 15.0 gm/dL Final  . Hematocrit 93/82/7974 46.4  35.0 - 47.0 % Final  . MCV (Mean Corpuscular Volume) 06/25/2023 91.7  80.0 - 100.0 fl Final  . MCH (Mean Corpuscular Hemoglobin) 06/25/2023 29.8  27.0 - 31.2 pg Final  . MCHC (Mean Corpuscular Hemoglobin *  06/25/2023 32.5  32.0 - 36.0 gm/dL Final  . Platelet Count 06/25/2023 292  150 - 450 10^3/uL Final  . RDW-CV (Red  Cell Distribution Widt* 06/25/2023 13.8  11.6 - 14.8 % Final  . MPV (Mean Platelet Volume) 06/25/2023 8.9 (L)  9.4 - 12.4 fl Final  . Neutrophils 06/25/2023 5.90  1.50 - 7.80 10^3/uL Final  . Lymphocytes 06/25/2023 1.60  1.00 - 3.60 10^3/uL Final  . Mixed Count 06/25/2023 0.90  0.10 - 0.90 10^3/uL Final  . Neutrophil % 06/25/2023 70.9 (H)  32.0 - 70.0 % Final  . Lymphocyte % 06/25/2023 18.9  10.0 - 50.0 % Final  . Mixed % 06/25/2023 10.2  3.0 - 14.4 % Final  Office Visit on 05/03/2023  Component Date Value Ref Range Status  . Hemoglobin A1C 05/03/2023 6.1 (H)  4.2 - 5.6 % Final  . Average Blood Glucose (Calc) 05/03/2023 128  mg/dL Final     Assessment and Plan:   Diagnoses and all orders for this visit:  Encounter for follow-up  Depression screening (Z13.31) -     Depression Screen -(PHQ- 2/9, BDI)  Benign essential hypertension Assessment & Plan: Stable  - Continue Avapro  300 mg po Q Day  - Continue Hydralazine  100 mg po BID  - Continue Labetalol  200 mg 1/2 tablet po BID  - Continue Lasix  20 mg po Q day  - Check BMET today  - Continue to f/u with Cardiology as instructed  Orders: -     Basic Metabolic Panel (BMP)  COPD, mild (CMS/HHS-HCC) Assessment & Plan: Stable  - Continue Albuterol inhaler prn  - Continue to f/u with Pulmonology as instructed    Allergic rhinitis due to pollen, unspecified seasonality Assessment & Plan: Stable  - Continue Zyrtec 10 mg po Q Day prn  - Continue Astelin  spray prn  - Continue Flonase daily   Venous insufficiency of both lower extremities Assessment & Plan: Stable  - Continue to f/u with Vascular as instructed   PAD (peripheral artery disease) () Assessment & Plan: Stable  - Continue ASA 8 mg po Q day  - Continue Pravastatin  40 mg po Q day     Type 2 diabetes, controlled, with neuropathy  (CMS/HHS-HCC) Assessment & Plan: Stable  - Continue Ozempic 2 mg/ week  - Continue to f/u with Endocrinology as instructed     Hyperlipidemia associated with type 2 diabetes mellitus (CMS-HCC) Assessment & Plan: Stable  - Continue Pravastatin  40 mg po Q day     Iron deficiency anemia due to chronic blood loss Assessment & Plan: Stable  - Continue Ferrous Sulfate 325 mg po Q Day  - Check CBC, anemia panel today  Orders: -     CBC w/auto Differential (3 Part) -     Ferritin -     Folate -     Iron -     TIBC/Transferrin  Insomnia due to medical condition Assessment & Plan: Stable   - Continue Trazodone  50 mg po Q HS prn   Gastroesophageal reflux disease with esophagitis, unspecified whether hemorrhage Assessment & Plan: Stable   - Continue Nexium 40 mg po Q day   Hypomagnesemia Assessment & Plan: Stable  - Continue Magnesium  Oxide 400 mg po Q day     Vitamin D  deficiency Assessment & Plan: Stable  - Continue Vitamin D  5000 iu po Q Day     Chronic kidney disease, stage 4 (severe) (CMS/HHS-HCC) Assessment & Plan: Stable  - Continue to renally adjust medication doses as appropriate  - Continue to follow up with Nephrology/Urology as instructed   OAB (overactive bladder) Assessment & Plan: Stable  - Continue  Vesicare  5 mg po Q Day  - Continue to f/u with Urology as needed     Personalized Health Advice diet, exercise, weight management, supplements, and mental health concerns  Return in about 6 months (around 12/25/2023), or if symptoms worsen or fail to improve, for Annual Wellness Visit, Fasting Labs., sooner as needed.    Current Medical Providers and Suppliers:   [facility] Patient Care Team: Steva Clotilda Conn, NP as PCP - General (Family Medicine) Stepp, Rosina Fuller, NP as Nurse Practitioner (Neurology) Tendler, Greig Hun, [place] as Physician Assistant (Urology) Jane Delmar Pike, NP as Nurse Practitioner  (Gastroenterology) Theotis Lavelle BRAVO, MD as Consulting Provider (Pulmonary Disease) Orman Kreg Pulling, NP as Nurse Practitioner (Endocrinology) Eli Ponds, OD as Optometrist (Optometry) Lane, Arthea Locus, MD as Consulting Provider (Neurology) Future Appointments     Date/Time Provider Department Center Visit Type   07/22/2023 2:15 PM Theotis Lavelle BRAVO, MD [facility] C RETURN VISIT   11/04/2023 2:00 PM Cherilyn Debby Quivers, MD [facility] RETURN VISIT   12/23/2023 1:30 PM Cantwell, Sherran Angles, PA [facility] C RETURN VISIT   12/26/2023 1:30 PM Steva Clotilda Conn, NP [facility] OFFICE VISIT   03/25/2024 2:00 PM Callwood, Cara Endow, MD [facility] C FOLLOW UP        Attestation Statement:   I personally performed the service, non-incident to. (WP)   SHANNON HIATT COWARD, NP  An after visit summary with all of these plans was provided for the patient either in written format or through MyChart.     Clotilda H. Coward, AGNP-C    *Some images could not be shown.

## 2023-07-08 ENCOUNTER — Other Ambulatory Visit (INDEPENDENT_AMBULATORY_CARE_PROVIDER_SITE_OTHER): Payer: Self-pay | Admitting: Nurse Practitioner

## 2023-07-08 DIAGNOSIS — I6523 Occlusion and stenosis of bilateral carotid arteries: Secondary | ICD-10-CM

## 2023-07-09 ENCOUNTER — Ambulatory Visit (INDEPENDENT_AMBULATORY_CARE_PROVIDER_SITE_OTHER): Payer: Medicare PPO | Admitting: Nurse Practitioner

## 2023-07-09 ENCOUNTER — Encounter (INDEPENDENT_AMBULATORY_CARE_PROVIDER_SITE_OTHER): Payer: Self-pay | Admitting: Nurse Practitioner

## 2023-07-09 ENCOUNTER — Ambulatory Visit (INDEPENDENT_AMBULATORY_CARE_PROVIDER_SITE_OTHER): Payer: Medicare PPO

## 2023-07-09 VITALS — BP 158/70 | HR 68 | Ht 62.0 in | Wt 186.1 lb

## 2023-07-09 DIAGNOSIS — E1140 Type 2 diabetes mellitus with diabetic neuropathy, unspecified: Secondary | ICD-10-CM | POA: Diagnosis not present

## 2023-07-09 DIAGNOSIS — I6523 Occlusion and stenosis of bilateral carotid arteries: Secondary | ICD-10-CM | POA: Diagnosis not present

## 2023-07-09 DIAGNOSIS — I10 Essential (primary) hypertension: Secondary | ICD-10-CM | POA: Diagnosis not present

## 2023-07-09 NOTE — Progress Notes (Signed)
 Subjective:    Patient ID: Jenna Olson, female    DOB: [birth year], 78 y.o.   MRN: [id number] Chief Complaint  Patient presents with   6 month carotid    The patient is seen for follow up evaluation of carotid stenosis status post left carotid stent on 09/08/2022.  There were no post operative problems or complications related to the surgery.  The patient denies neck or incisional pain.  She presented to the emergency room shortly after the surgery with concern for some blurry vision but the vascular surgery on-call ruled out any vascular involvement or relation to the carotid stent placement. Prior to intervention the patient was having episodes of dizziness but this has improved postintervention.  Since her last visit on 01/08/2023 she has not had any focal neurological issues.  Her largest complaint is of bruising.  The patient denies interval amaurosis fugax. There is no recent history of TIA symptoms or focal motor deficits. There is no prior documented CVA.  The patient denies headache.  The patient is taking enteric-coated aspirin  81 mg daily.  No recent shortening of the patient's walking distance or new symptoms consistent with claudication.  No history of rest pain symptoms. No new ulcers or wounds of the lower extremities have occurred.  There is no history of DVT, PE or superficial thrombophlebitis. No recent episodes of angina or shortness of breath documented.    Today the patient has a 40 to 59% in the right ICA but velocities have improved in the left ICA and 01 to 39% stenosis.     Review of Systems  Eyes:  Negative for visual disturbance.  Neurological:  Negative for dizziness.  All other systems reviewed and are negative.      Objective:   Physical Exam Vitals reviewed.  HENT:     Head: Normocephalic.  Neck:     Vascular: No carotid bruit.  Cardiovascular:     Rate and Rhythm: Normal rate.     Pulses: Normal pulses.  Pulmonary:     Effort:  Pulmonary effort is normal.  Skin:    General: Skin is warm and dry.  Neurological:     Mental Status: She is alert and oriented to person, place, and time.  Psychiatric:        Mood and Affect: Mood normal.        Behavior: Behavior normal.        Thought Content: Thought content normal.        Judgment: Judgment normal.     BP (!) 158/70   Pulse 68   Ht 5' 2 (1.575 m)   Wt 186 lb 2 oz (84.4 kg)   BMI 34.04 kg/m   Past Medical History:  Diagnosis Date   Cancer (HCC)    skin   Chronic kidney disease    COPD (chronic obstructive pulmonary disease) (HCC)    Diabetes mellitus without complication (HCC)    Dyspnea    Dysuria    GERD (gastroesophageal reflux disease)    High cholesterol    Hypertension    Incontinence    Proteinuria    Sleep apnea    Urinary frequency    Urinary urgency    UTI (lower urinary tract infection)     Social History   Socioeconomic History   Marital status: Widowed    Spouse name: Not on file   Number of children: Not on file   Years of education: Not on file   Highest education level:  Not on file  Occupational History   Not on file  Tobacco Use   Smoking status: Former   Smokeless tobacco: Never   Tobacco comments:    quit 15 years  Vaping Use   Vaping status: Never Used  Substance and Sexual Activity   Alcohol  use: No    Alcohol /week: 0.0 standard drinks of alcohol    Drug use: No   Sexual activity: Not on file  Other Topics Concern   Not on file  Social History Narrative   Not on file   Social Drivers of Health   Financial Resource Strain: Low Risk  (06/25/2023)   Received from [facility]   Overall Financial Resource Strain (CARDIA)    Difficulty of Paying Living Expenses: Not hard at all  Food Insecurity: No Food Insecurity (06/25/2023)   Received from [facility]   Hunger Vital Sign    Within the past 12 months, you worried that your food would run out before you got the money to  buy more.: Never true    Within the past 12 months, the food you bought just didn't last and you didn't have money to get more.: Never true  Transportation Needs: No Transportation Needs (06/25/2023)   Received from [facility] - Transportation    In the past 12 months, has lack of transportation kept you from medical appointments or from getting medications?: No    Lack of Transportation (Non-Medical): No  Physical Activity: Inactive (06/25/2023)   Received from [facility]   Exercise Vital Sign    On average, how many days per week do you engage in moderate to strenuous exercise (like a brisk walk)?: 0 days    On average, how many minutes do you engage in exercise at this level?: 0 min  Stress: No Stress Concern Present (06/25/2023)   Received from [facility] - Occupational Stress Questionnaire    Feeling of Stress : Not at all  Social Connections: Moderately Isolated (06/25/2023)   Received from [facility]   Social Connection and Isolation Panel    In a typical week, how many times do you talk on the phone with family, friends, or neighbors?: More than three times a week    How often do you get together with friends or relatives?: Twice a week    How often do you attend church or religious services?: More than 4 times per year    Do you belong to any clubs or organizations such as church groups, unions, fraternal or athletic groups, or school groups?: No    How often do you attend meetings of the clubs or organizations you belong to?: Never    Are you married, widowed, divorced, separated, never married, or living with a partner?: Widowed  Intimate Partner Violence: Not on file    Past Surgical History:  Procedure Laterality Date   ABDOMINAL HYSTERECTOMY     BREAST BIOPSY Left    neg   CAROTID ANGIOGRAPHY Left 08/27/2022   Procedure: CAROTID ANGIOGRAPHY;   Surgeon: Marea Selinda RAMAN, MD;  Location: [facility] INVASIVE CV LAB;  Service: Cardiovascular;  Laterality: Left;   CHOLECYSTECTOMY     COLONOSCOPY WITH PROPOFOL  N/A 02/18/2018   Procedure: COLONOSCOPY WITH PROPOFOL ;  Surgeon: Gaylyn Gladis PENNER, MD;  Location: [facility] ENDOSCOPY;  Service: Endoscopy;  Laterality: N/A;   MEDIAL PARTIAL KNEE REPLACEMENT Right    PARTIAL  HYSTERECTOMY     RIGHT HEART CATH N/A 02/23/2021   Procedure: RIGHT HEART CATH;  Surgeon: Florencio Cara BIRCH, MD;  Location: [facility] INVASIVE CV LAB;  Service: Cardiovascular;  Laterality: N/A;   TONSILLECTOMY      Family History  Problem Relation Age of Onset   Cancer Grandchild        ewing sarcoma   Bladder Cancer Maternal Uncle    Prostate cancer Maternal Uncle    Kidney disease Neg Hx    Breast cancer Neg Hx     Allergies  Allergen Reactions   Atorvastatin Other (See Comments)    Elevated lft's   Hydrochlorothiazide Other (See Comments)    Hyponatremia   Sulfa Antibiotics Other (See Comments)    Child hood allergy   Amlodipine Swelling   Amoxicillin Rash    Rash in her mouth.  Painful.       Latest Ref Rng & Units 09/08/2022    1:13 PM 08/28/2022    5:42 AM 01/14/2018    1:44 PM  CBC  WBC 4.0 - 10.5 K/uL 9.7  7.5  10.4   Hemoglobin 12.0 - 15.0 g/dL 87.3  89.1  87.3   Hematocrit 36.0 - 46.0 % 39.8  33.7  38.6   Platelets 150 - 400 K/uL 328  240  339       CMP     Component Value Date/Time   NA 132 (L) 09/08/2022 1313   NA 136 01/27/2013 1123   K 5.0 09/08/2022 1313   K 4.0 01/27/2013 1123   CL 94 (L) 09/08/2022 1313   CL 100 01/27/2013 1123   CO2 26 09/08/2022 1313   CO2 30 01/27/2013 1123   GLUCOSE 107 (H) 09/08/2022 1313   GLUCOSE 150 (H) 01/27/2013 1123   BUN 30 (H) 09/08/2022 1313   BUN 13 01/27/2013 1123   CREATININE 2.03 (H) 09/08/2022 1313   CREATININE 1.05 01/27/2013 1123   CALCIUM  9.2 09/08/2022 1313   CALCIUM  9.3 01/27/2013 1123   PROT 7.4 09/08/2022 1313   ALBUMIN 4.1 09/08/2022 1313   AST  13 (L) 09/08/2022 1313   ALT 11 09/08/2022 1313   ALKPHOS 70 09/08/2022 1313   BILITOT 0.7 09/08/2022 1313   GFRNONAA 25 (L) 09/08/2022 1313   GFRNONAA 55 (L) 01/27/2013 1123     No results found.     Assessment & Plan:   1. Bilateral carotid artery stenosis Recommend:  Given the patient's asymptomatic subcritical stenosis no further invasive testing or surgery at this time.  Duplex ultrasound shows 40 to 59% stenosis of the right ICA with 1 to 39% stenosis in the left ICA.  Continue antiplatelet therapy as prescribed Continue management of CAD, HTN and Hyperlipidemia Healthy heart diet,  encouraged exercise at least 4 times per week  Follow up in 6 months with duplex ultrasound and physical exam, since she previously had high velocities in the right ICA.  She will continue with her antiplatelet therapy.  We note that at her next follow-up visit we may discuss adjusting to help with some bruising.  2. Benign essential hypertension Continue antihypertensive medications as already ordered, these medications have been reviewed and there are no changes at this time.  3. Type 2 diabetes, controlled, with neuropathy (HCC) Continue hypoglycemic medications as already ordered, these medications have been reviewed and there are no changes at this time.  Hgb A1C to be monitored as already arranged by primary service     Current Outpatient Medications on File  Prior to Visit  Medication Sig Dispense Refill   acetaminophen  (TYLENOL ) 500 MG tablet Take 500 mg by mouth every 6 (six) hours as needed for mild pain.     aspirin  EC 81 MG tablet Take 81 mg by mouth daily.     azelastine  (ASTELIN ) 0.1 % nasal spray Place 1 spray into both nostrils daily.     b complex vitamins capsule Take 1 capsule by mouth daily.     carboxymethylcellulose (REFRESH PLUS) 0.5 % SOLN Place 1 drop into both eyes daily as needed (dry eyes).     Cholecalciferol  25 MCG (1000 UT) capsule Take 1,000 Units by  mouth daily.     clopidogrel  (PLAVIX ) 75 MG tablet Take 1 tablet (75 mg total) by mouth daily at 6 (six) AM. 30 tablet 11   esomeprazole (NEXIUM) 40 MG capsule Take 40 mg by mouth daily at 12 noon.     furosemide  (LASIX ) 20 MG tablet Take 20 mg by mouth daily.     gabapentin  (NEURONTIN ) 300 MG capsule Take 300 mg by mouth at bedtime.     hydrALAZINE  (APRESOLINE ) 100 MG tablet Take 100 mg by mouth 2 (two) times daily.     irbesartan  (AVAPRO ) 300 MG tablet Take 300 mg by mouth daily.     labetalol  (NORMODYNE ) 200 MG tablet Take 200 mg by mouth 2 (two) times daily.     loratadine  (CLARITIN ) 10 MG tablet Take 10 mg by mouth daily.     methenamine  (HIPREX ) 1 g tablet Take 1 g by mouth 2 (two) times daily.     omega-3 fish oil (MAXEPA) 1000 MG CAPS capsule Take 1 capsule by mouth daily.     OZEMPIC, 2 MG/DOSE, 8 MG/3ML SOPN Inject 2 mg into the skin once a week.     polycarbophil (FIBERCON) 625 MG tablet Take 625 mg by mouth daily.     pravastatin  (PRAVACHOL ) 40 MG tablet Take 40 mg by mouth daily.     pregabalin  (LYRICA ) 100 MG capsule Take 100-200 mg by mouth See admin instructions. Take 100 mg in the AM and afternoon  and 2 capsules (200 mg) at bedtime     rOPINIRole  (REQUIP ) 1 MG tablet Take 1 mg by mouth at bedtime.     solifenacin  (VESICARE ) 5 MG tablet Take 1 tablet (5 mg total) by mouth daily. 90 tablet 4   traZODone  (DESYREL ) 50 MG tablet Take 50 mg by mouth at bedtime.     No current facility-administered medications on file prior to visit.    There are no Patient Instructions on file for this visit. No follow-ups on file.   Kambria Grima E Kanijah Groseclose, NP

## 2023-07-22 DIAGNOSIS — J449 Chronic obstructive pulmonary disease, unspecified: Secondary | ICD-10-CM | POA: Diagnosis not present

## 2023-07-22 DIAGNOSIS — I272 Pulmonary hypertension, unspecified: Secondary | ICD-10-CM | POA: Diagnosis not present

## 2023-07-22 DIAGNOSIS — R942 Abnormal results of pulmonary function studies: Secondary | ICD-10-CM | POA: Diagnosis not present

## 2023-07-22 DIAGNOSIS — G4733 Obstructive sleep apnea (adult) (pediatric): Secondary | ICD-10-CM | POA: Diagnosis not present

## 2023-08-09 DIAGNOSIS — Z85828 Personal history of other malignant neoplasm of skin: Secondary | ICD-10-CM | POA: Diagnosis not present

## 2023-08-09 DIAGNOSIS — Z859 Personal history of malignant neoplasm, unspecified: Secondary | ICD-10-CM | POA: Diagnosis not present

## 2023-08-09 DIAGNOSIS — L821 Other seborrheic keratosis: Secondary | ICD-10-CM | POA: Diagnosis not present

## 2023-08-09 DIAGNOSIS — Z872 Personal history of diseases of the skin and subcutaneous tissue: Secondary | ICD-10-CM | POA: Diagnosis not present

## 2023-08-09 DIAGNOSIS — L578 Other skin changes due to chronic exposure to nonionizing radiation: Secondary | ICD-10-CM | POA: Diagnosis not present

## 2023-08-09 DIAGNOSIS — L570 Actinic keratosis: Secondary | ICD-10-CM | POA: Diagnosis not present

## 2023-08-09 DIAGNOSIS — Z86018 Personal history of other benign neoplasm: Secondary | ICD-10-CM | POA: Diagnosis not present

## 2023-08-12 ENCOUNTER — Other Ambulatory Visit (INDEPENDENT_AMBULATORY_CARE_PROVIDER_SITE_OTHER): Payer: Self-pay

## 2023-08-12 MED ORDER — CLOPIDOGREL BISULFATE 75 MG PO TABS: 75.0000 mg | ORAL_TABLET | Freq: Every day | ORAL | 11 refills | Status: DC

## 2023-09-17 DIAGNOSIS — R202 Paresthesia of skin: Secondary | ICD-10-CM | POA: Diagnosis not present

## 2023-09-17 DIAGNOSIS — Z1331 Encounter for screening for depression: Secondary | ICD-10-CM | POA: Diagnosis not present

## 2023-10-31 NOTE — Progress Notes (Addendum)
 Jenna Olson                                          MRN: [id number]   10/31/2023   The [facility] Quality Team Specialist reviewed this patient medical record for the purposes of chart review for care gap closure. The following were reviewed: chart review for care gap closure-controlling blood pressure.  01/30/2024- ABSTRACTED CBP    [facility] Quality Team

## 2023-11-04 DIAGNOSIS — E1142 Type 2 diabetes mellitus with diabetic polyneuropathy: Secondary | ICD-10-CM | POA: Diagnosis not present

## 2023-12-31 ENCOUNTER — Other Ambulatory Visit (INDEPENDENT_AMBULATORY_CARE_PROVIDER_SITE_OTHER): Payer: Self-pay | Admitting: Nurse Practitioner

## 2023-12-31 DIAGNOSIS — I6523 Occlusion and stenosis of bilateral carotid arteries: Secondary | ICD-10-CM

## 2024-01-13 ENCOUNTER — Ambulatory Visit (INDEPENDENT_AMBULATORY_CARE_PROVIDER_SITE_OTHER): Admitting: Nurse Practitioner

## 2024-01-13 ENCOUNTER — Encounter (INDEPENDENT_AMBULATORY_CARE_PROVIDER_SITE_OTHER): Payer: Self-pay | Admitting: Nurse Practitioner

## 2024-01-13 ENCOUNTER — Ambulatory Visit (INDEPENDENT_AMBULATORY_CARE_PROVIDER_SITE_OTHER)

## 2024-01-13 VITALS — BP 182/72 | HR 57 | Resp 18 | Ht 62.0 in | Wt 176.4 lb

## 2024-01-13 DIAGNOSIS — E1140 Type 2 diabetes mellitus with diabetic neuropathy, unspecified: Secondary | ICD-10-CM

## 2024-01-13 DIAGNOSIS — I6523 Occlusion and stenosis of bilateral carotid arteries: Secondary | ICD-10-CM

## 2024-01-13 DIAGNOSIS — I10 Essential (primary) hypertension: Secondary | ICD-10-CM

## 2024-01-13 MED ORDER — CLOPIDOGREL BISULFATE 75 MG PO TABS: 75.0000 mg | ORAL_TABLET | Freq: Every day | ORAL | 11 refills | Status: AC

## 2024-01-19 ENCOUNTER — Encounter (INDEPENDENT_AMBULATORY_CARE_PROVIDER_SITE_OTHER): Payer: Self-pay | Admitting: Nurse Practitioner

## 2024-01-19 NOTE — Progress Notes (Signed)
 "  Subjective:    Patient ID: Jenna Olson, female    DOB: [birth year], 79 y.o.   MRN: [id number] Chief Complaint  Patient presents with   Follow-up    6 month follow up + carotid, pt unsteady on feet after ultrasound    HPI  Discussed the use of AI scribe software for clinical note transcription with the patient, who gave verbal consent to proceed.  History of Present Illness Jenna Olson is a 79 year old female with bilateral carotid artery stenosis, status post left carotid stent, who presents for follow-up of carotid disease and antiplatelet-associated bruising.  She reports persistent, extensive ecchymoses, particularly on her legs, which develop easily and without identifiable trauma. The bruising is bothersome but she prefers to continue both aspirin  and clopidogrel , stating she can tolerate the symptoms. She also notes increased cold intolerance, which she wonders may be medication-related.  She experienced a single episode of transient dizziness following a recent carotid ultrasound, which resolved with oral hydration and rest. She currently denies ongoing dizziness or other neurological symptoms.  Recent carotid ultrasound showed 40-59% stenosis in the right carotid artery. The left carotid artery, which was previously stented, showed mildly elevated velocities on recent ultrasound, with no significant stenosis reported. She is aware of a longstanding bruit over the stented area.    Results Diagnostic Carotid duplex ultrasound: Right carotid artery stenosis 40-59%. Left carotid artery with stent, no significant stenosis. Mildly increased velocities on left, consistent with 1-39% stenosis. Findings stable compared to prior studies.   Review of Systems  Neurological:  Positive for dizziness.  All other systems reviewed and are negative.      Objective:   Physical Exam Vitals reviewed.  HENT:     Head: Normocephalic.  Neck:     Vascular: Carotid bruit  present.  Cardiovascular:     Rate and Rhythm: Normal rate.     Pulses: Normal pulses.  Pulmonary:     Effort: Pulmonary effort is normal.  Skin:    General: Skin is warm and dry.  Neurological:     Mental Status: She is alert and oriented to person, place, and time.  Psychiatric:        Mood and Affect: Mood normal.        Behavior: Behavior normal.        Thought Content: Thought content normal.        Judgment: Judgment normal.     Physical Exam CARDIOVASCULAR: Carotid bruit present.  BP (!) 182/72 (BP Location: Left Arm, Patient Position: Sitting, Cuff Size: Normal)   Pulse (!) 57   Resp 18   Ht 5' 2 (1.575 m)   Wt 176 lb 6.4 oz (80 kg)   BMI 32.26 kg/m   Past Medical History:  Diagnosis Date   Cancer (HCC)    skin   Chronic kidney disease    COPD (chronic obstructive pulmonary disease) (HCC)    Diabetes mellitus without complication (HCC)    Dyspnea    Dysuria    GERD (gastroesophageal reflux disease)    High cholesterol    Hypertension    Incontinence    Proteinuria    Sleep apnea    Urinary frequency    Urinary urgency    UTI (lower urinary tract infection)     Social History   Socioeconomic History   Marital status: Widowed    Spouse name: Not on file   Number of children: Not on file   Years of education:  Not on file   Highest education level: Not on file  Occupational History   Not on file  Tobacco Use   Smoking status: Former   Smokeless tobacco: Never   Tobacco comments:    quit 15 years  Vaping Use   Vaping status: Never Used  Substance and Sexual Activity   Alcohol  use: No    Alcohol /week: 0.0 standard drinks of alcohol    Drug use: No   Sexual activity: Not on file  Other Topics Concern   Not on file  Social History Narrative   Not on file   Social Drivers of Health   Tobacco Use: Medium Risk (01/19/2024)   Patient History    Smoking Tobacco Use: Former    Smokeless Tobacco Use: Never    Passive Exposure: Not on Programmer, Applications Strain: Low Risk  (12/26/2023)   Received from [facility]   Overall Financial Resource Strain (CARDIA)    Difficulty of Paying Living Expenses: Not hard at all  Food Insecurity: No Food Insecurity (12/26/2023)   Received from [facility]   Epic    Within the past 12 months, you worried that your food would run out before you got the money to buy more.: Never true    Within the past 12 months, the food you bought just didn't last and you didn't have money to get more.: Never true  Transportation Needs: No Transportation Needs (12/26/2023)   Received from [facility] - Transportation    In the past 12 months, has lack of transportation kept you from medical appointments or from getting medications?: No    Lack of Transportation (Non-Medical): No  Physical Activity: Inactive (12/26/2023)   Received from [facility]   Exercise Vital Sign    On average, how many days per week do you engage in moderate to strenuous exercise (like a brisk walk)?: 0 days    On average, how many minutes do you engage in exercise at this level?: 0 min  Stress: No Stress Concern Present (12/26/2023)   Received from [facility] - Occupational Stress Questionnaire    Feeling of Stress : Not at all  Social Connections: Moderately Isolated (12/26/2023)   Received from [facility]   Social Connection and Isolation Panel    In a typical week, how many times do you talk on the phone with family, friends, or neighbors?: More than three times a week    How often do you get together with friends or relatives?: Twice a week    How often do you attend church or religious services?: More than 4 times per year    Do you belong to any clubs or organizations such as church groups, unions, fraternal or athletic groups, or school groups?: No    How  often do you attend meetings of the clubs or organizations you belong to?: Never    Are you married, widowed, divorced, separated, never married, or living with a partner?: Widowed  Intimate Partner Violence: Not on file  Depression (PHQ2-9): Not on file  Alcohol  Screen: Not on file  Housing: Low Risk  (12/26/2023)   Received from [facility]   Epic    In the last 12 months, was there a time when you were not able to pay the mortgage or rent on time?: No    In the past  12 months, how many times have you moved where you were living?: 0    At any time in the past 12 months, were you homeless or living in a shelter (including now)?: No  Utilities: Not At Risk (12/26/2023)   Received from [facility]   Epic    In the past 12 months has the electric, gas, oil, or water company threatened to shut off services in your home?: No  Health Literacy: Adequate Health Literacy (12/26/2023)   Received from [facility]   B1300 Health Literacy    How often do you need to have someone help you when you read instructions, pamphlets, or other written material from your doctor or pharmacy?: Never    Past Surgical History:  Procedure Laterality Date   ABDOMINAL HYSTERECTOMY     BREAST BIOPSY Left    neg   CAROTID ANGIOGRAPHY Left 08/27/2022   Procedure: CAROTID ANGIOGRAPHY;  Surgeon: Marea Selinda RAMAN, MD;  Location: [facility] INVASIVE CV LAB;  Service: Cardiovascular;  Laterality: Left;   CHOLECYSTECTOMY     COLONOSCOPY WITH PROPOFOL  N/A 02/18/2018   Procedure: COLONOSCOPY WITH PROPOFOL ;  Surgeon: Gaylyn Gladis PENNER, MD;  Location: [facility] ENDOSCOPY;  Service: Endoscopy;  Laterality: N/A;   MEDIAL PARTIAL KNEE REPLACEMENT Right    PARTIAL HYSTERECTOMY     RIGHT HEART CATH N/A 02/23/2021   Procedure: RIGHT HEART CATH;  Surgeon: Florencio Cara BIRCH, MD;  Location: [facility] INVASIVE CV LAB;  Service: Cardiovascular;  Laterality: N/A;   TONSILLECTOMY      Family History   Problem Relation Age of Onset   Cancer Grandchild        ewing sarcoma   Bladder Cancer Maternal Uncle    Prostate cancer Maternal Uncle    Kidney disease Neg Hx    Breast cancer Neg Hx     Allergies[1]     Latest Ref Rng & Units 09/08/2022    1:13 PM 08/28/2022    5:42 AM 01/14/2018    1:44 PM  CBC  WBC 4.0 - 10.5 K/uL 9.7  7.5  10.4   Hemoglobin 12.0 - 15.0 g/dL 87.3  89.1  87.3   Hematocrit 36.0 - 46.0 % 39.8  33.7  38.6   Platelets 150 - 400 K/uL 328  240  339       CMP     Component Value Date/Time   NA 132 (L) 09/08/2022 1313   NA 136 01/27/2013 1123   K 5.0 09/08/2022 1313   K 4.0 01/27/2013 1123   CL 94 (L) 09/08/2022 1313   CL 100 01/27/2013 1123   CO2 26 09/08/2022 1313   CO2 30 01/27/2013 1123   GLUCOSE 107 (H) 09/08/2022 1313   GLUCOSE 150 (H) 01/27/2013 1123   BUN 30 (H) 09/08/2022 1313   BUN 13 01/27/2013 1123   CREATININE 2.03 (H) 09/08/2022 1313   CREATININE 1.05 01/27/2013 1123   CALCIUM  9.2 09/08/2022 1313   CALCIUM  9.3 01/27/2013 1123   PROT 7.4 09/08/2022 1313   ALBUMIN 4.1 09/08/2022 1313   AST 13 (L) 09/08/2022 1313   ALT 11 09/08/2022 1313   ALKPHOS 70 09/08/2022 1313   BILITOT 0.7 09/08/2022 1313   GFRNONAA 25 (L) 09/08/2022 1313   GFRNONAA 55 (L) 01/27/2013 1123     No results found.     Assessment & Plan:   1. Bilateral carotid artery stenosis (Primary) Bilateral carotid artery stenosis with left carotid stent Bilateral carotid stenosis with stable right stenosis  at 40-59% and left stented stenosis at 1-39%. Left carotid bruit benign due to stent-related turbulent flow. No intervention needed. - Continue carotid disease surveillance with follow-up ultrasound in six months. - Educated her regarding the benign nature of the bruit in the context of her stent. - Discussed the possibility of extending surveillance interval to yearly if stability persists at next visit. - Discussed option to hold aspirin  to assess for improvement in  ecchymosis; she elected to continue aspirin  despite bruising. - Reordered clopidogrel  (Plavix ) refill as requested. - Provided reassurance regarding ecchymosis and discussed its commonality with antiplatelet therapy.  - VAS US  CAROTID; Future  2. Benign essential hypertension Continue antihypertensive medications as already ordered, these medications have been reviewed and there are no changes at this time.  3. Type 2 diabetes, controlled, with neuropathy (HCC) Continue hypoglycemic medications as already ordered, these medications have been reviewed and there are no changes at this time.  Hgb A1C to be monitored as already arranged by primary service    Current Outpatient Medications on File Prior to Visit  Medication Sig Dispense Refill   acetaminophen  (TYLENOL ) 500 MG tablet Take 500 mg by mouth every 6 (six) hours as needed for mild pain.     aspirin  EC 81 MG tablet Take 81 mg by mouth daily.     azelastine  (ASTELIN ) 0.1 % nasal spray Place 1 spray into both nostrils daily.     b complex vitamins capsule Take 1 capsule by mouth daily.     carboxymethylcellulose (REFRESH PLUS) 0.5 % SOLN Place 1 drop into both eyes daily as needed (dry eyes).     Cholecalciferol  25 MCG (1000 UT) capsule Take 1,000 Units by mouth daily.     esomeprazole (NEXIUM) 40 MG capsule Take 40 mg by mouth daily at 12 noon.     furosemide  (LASIX ) 20 MG tablet Take 20 mg by mouth daily.     gabapentin  (NEURONTIN ) 300 MG capsule Take 300 mg by mouth at bedtime.     hydrALAZINE  (APRESOLINE ) 100 MG tablet Take 100 mg by mouth 2 (two) times daily.     irbesartan  (AVAPRO ) 300 MG tablet Take 300 mg by mouth daily.     labetalol  (NORMODYNE ) 200 MG tablet Take 200 mg by mouth 2 (two) times daily.     loratadine  (CLARITIN ) 10 MG tablet Take 10 mg by mouth daily.     methenamine  (HIPREX ) 1 g tablet Take 1 g by mouth 2 (two) times daily.     omega-3 fish oil (MAXEPA) 1000 MG CAPS capsule Take 1 capsule by mouth daily.      OZEMPIC, 2 MG/DOSE, 8 MG/3ML SOPN Inject 2 mg into the skin once a week.     polycarbophil (FIBERCON) 625 MG tablet Take 625 mg by mouth daily.     pravastatin  (PRAVACHOL ) 40 MG tablet Take 40 mg by mouth daily.     pregabalin  (LYRICA ) 100 MG capsule Take 100-200 mg by mouth See admin instructions. Take 100 mg in the AM and afternoon  and 2 capsules (200 mg) at bedtime     rOPINIRole  (REQUIP ) 1 MG tablet Take 1 mg by mouth at bedtime.     solifenacin  (VESICARE ) 5 MG tablet Take 1 tablet (5 mg total) by mouth daily. 90 tablet 4   traZODone  (DESYREL ) 50 MG tablet Take 50 mg by mouth at bedtime.     No current facility-administered medications on file prior to visit.    There are no Patient Instructions on file  for this visit. Return in about 6 months (around 07/12/2024) for Carotid Duplex in 6 month  Jd/FB.   Kashonda Sarkisyan E Nieve Rojero, NP      [1]  Allergies Allergen Reactions   Atorvastatin Other (See Comments)    Elevated lft's   Hydrochlorothiazide Other (See Comments)    Hyponatremia   Sulfa Antibiotics Other (See Comments)    Child hood allergy   Amlodipine Swelling   Amoxicillin Rash    Rash in her mouth.  Painful.   "

## 2024-07-20 ENCOUNTER — Encounter (INDEPENDENT_AMBULATORY_CARE_PROVIDER_SITE_OTHER)

## 2024-07-20 ENCOUNTER — Ambulatory Visit (INDEPENDENT_AMBULATORY_CARE_PROVIDER_SITE_OTHER): Admitting: Nurse Practitioner
